# Patient Record
Sex: Female | Born: 1975 | Race: Black or African American | Hispanic: No | Marital: Married | Smoking: Never smoker
Health system: Southern US, Community
[De-identification: ages and names within clinical notes are randomized; demographics above are authoritative.]

## PROBLEM LIST (undated history)

## (undated) DIAGNOSIS — M67912 Unspecified disorder of synovium and tendon, left shoulder: Secondary | ICD-10-CM

## (undated) DIAGNOSIS — I219 Acute myocardial infarction, unspecified: Secondary | ICD-10-CM

## (undated) DIAGNOSIS — I639 Cerebral infarction, unspecified: Secondary | ICD-10-CM

## (undated) DIAGNOSIS — I341 Nonrheumatic mitral (valve) prolapse: Secondary | ICD-10-CM

## (undated) DIAGNOSIS — K529 Noninfective gastroenteritis and colitis, unspecified: Secondary | ICD-10-CM

## (undated) DIAGNOSIS — G589 Mononeuropathy, unspecified: Secondary | ICD-10-CM

## (undated) DIAGNOSIS — K219 Gastro-esophageal reflux disease without esophagitis: Secondary | ICD-10-CM

## (undated) DIAGNOSIS — J40 Bronchitis, not specified as acute or chronic: Secondary | ICD-10-CM

## (undated) DIAGNOSIS — D649 Anemia, unspecified: Secondary | ICD-10-CM

## (undated) DIAGNOSIS — E039 Hypothyroidism, unspecified: Secondary | ICD-10-CM

## (undated) DIAGNOSIS — F419 Anxiety disorder, unspecified: Secondary | ICD-10-CM

## (undated) HISTORY — DX: Unspecified disorder of synovium and tendon, left shoulder: M67.912

## (undated) HISTORY — DX: Mononeuropathy, unspecified: G58.9

## (undated) HISTORY — PX: WISDOM TOOTH EXTRACTION: SHX21

## (undated) HISTORY — PX: DILATION AND CURETTAGE OF UTERUS: SHX78

## (undated) HISTORY — PX: ABDOMINAL HYSTERECTOMY: SHX81

---

## 2006-11-08 DIAGNOSIS — I219 Acute myocardial infarction, unspecified: Secondary | ICD-10-CM

## 2006-11-08 HISTORY — DX: Acute myocardial infarction, unspecified: I21.9

## 2007-01-04 DIAGNOSIS — I639 Cerebral infarction, unspecified: Secondary | ICD-10-CM

## 2007-01-04 HISTORY — DX: Cerebral infarction, unspecified: I63.9

## 2014-04-09 ENCOUNTER — Encounter (HOSPITAL_COMMUNITY): Payer: Self-pay | Admitting: Emergency Medicine

## 2014-04-09 ENCOUNTER — Emergency Department (HOSPITAL_COMMUNITY)
Admission: EM | Admit: 2014-04-09 | Discharge: 2014-04-09 | Disposition: A | Discharge status: Home / Self Care | Payer: BC Managed Care – PPO | Attending: Emergency Medicine | Admitting: Emergency Medicine

## 2014-04-09 DIAGNOSIS — Z8709 Personal history of other diseases of the respiratory system: Secondary | ICD-10-CM | POA: Insufficient documentation

## 2014-04-09 DIAGNOSIS — Z8673 Personal history of transient ischemic attack (TIA), and cerebral infarction without residual deficits: Secondary | ICD-10-CM | POA: Diagnosis not present

## 2014-04-09 DIAGNOSIS — M791 Myalgia: Secondary | ICD-10-CM | POA: Diagnosis present

## 2014-04-09 DIAGNOSIS — M79602 Pain in left arm: Secondary | ICD-10-CM | POA: Diagnosis not present

## 2014-04-09 DIAGNOSIS — I252 Old myocardial infarction: Secondary | ICD-10-CM | POA: Diagnosis not present

## 2014-04-09 HISTORY — DX: Cerebral infarction, unspecified: I63.9

## 2014-04-09 HISTORY — DX: Acute myocardial infarction, unspecified: I21.9

## 2014-04-09 HISTORY — DX: Bronchitis, not specified as acute or chronic: J40

## 2014-04-09 LAB — I-STAT TROPONIN, ED: Troponin i, poc: 0 ng/mL (ref 0.00–0.08)

## 2014-04-09 LAB — CBC WITH DIFFERENTIAL/PLATELET
Basophils Absolute: 0 10*3/uL (ref 0.0–0.1)
Basophils Relative: 0 % (ref 0–1)
EOS ABS: 0.2 10*3/uL (ref 0.0–0.7)
Eosinophils Relative: 3 % (ref 0–5)
HCT: 38.6 % (ref 36.0–46.0)
Hemoglobin: 11.9 g/dL — ABNORMAL LOW (ref 12.0–15.0)
LYMPHS PCT: 38 % (ref 12–46)
Lymphs Abs: 2.9 10*3/uL (ref 0.7–4.0)
MCH: 23.4 pg — AB (ref 26.0–34.0)
MCHC: 30.8 g/dL (ref 30.0–36.0)
MCV: 76 fL — ABNORMAL LOW (ref 78.0–100.0)
Monocytes Absolute: 0.6 10*3/uL (ref 0.1–1.0)
Monocytes Relative: 8 % (ref 3–12)
NEUTROS PCT: 51 % (ref 43–77)
Neutro Abs: 3.8 10*3/uL (ref 1.7–7.7)
PLATELETS: 302 10*3/uL (ref 150–400)
RBC: 5.08 MIL/uL (ref 3.87–5.11)
RDW: 14.7 % (ref 11.5–15.5)
WBC: 7.5 10*3/uL (ref 4.0–10.5)

## 2014-04-09 LAB — BASIC METABOLIC PANEL WITH GFR
Anion gap: 15 (ref 5–15)
BUN: 17 mg/dL (ref 6–23)
CO2: 20 meq/L (ref 19–32)
Calcium: 9.1 mg/dL (ref 8.4–10.5)
Chloride: 106 meq/L (ref 96–112)
Creatinine, Ser: 0.71 mg/dL (ref 0.50–1.10)
GFR calc Af Amer: >90 mL/min
GFR calc non Af Amer: >90 mL/min
Glucose, Bld: 89 mg/dL (ref 70–99)
Potassium: 3.9 meq/L (ref 3.7–5.3)
Sodium: 141 meq/L (ref 137–147)

## 2014-04-09 NOTE — ED Notes (Addendum)
Pt c/o L forearm tightness radiating in upper arm and bilateral "sweaty palms" starting around 1730.  Pain score 5/10.  Pt sts "it feels like my arm fell asleep on me."  Hx of MI and stroke in 10/2006.  Pt reports similar symptoms w/ MI.
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
## 2014-04-09 NOTE — Discharge Instructions (Signed)
Followup with cardiology within the next week. Followup with primary care as well. Return to the ER if you develop any chest pain, shortness of breath, sweatiness, nausea, vomiting, pain that radiates to your left arm or jaw, dizziness, weakness, blurred or change in vision.   Pain of Unknown Etiology (Pain Without a Known Cause) You have come to your caregiver because of pain. Pain can occur in any part of the body. Often there is not a definite cause. If your laboratory (blood or urine) work was normal and X-rays or other studies were normal, your caregiver may treat you without knowing the cause of the pain. An example of this is the headache. Most headaches are diagnosed by taking a history. This means your caregiver asks you questions about your headaches. Your caregiver determines a treatment based on your answers. Usually testing done for headaches is normal. Often testing is not done unless there is no response to medications. Regardless of where your pain is located today, you can be given medications to make you comfortable. If no physical cause of pain can be found, most cases of pain will gradually leave as suddenly as they came.  If you have a painful condition and no reason can be found for the pain, it is important that you follow up with your caregiver. If the pain becomes worse or does not go away, it may be necessary to repeat tests and look further for a possible cause.  Only take over-the-counter or prescription medicines for pain, discomfort, or fever as directed by your caregiver.  For the protection of your privacy, test results cannot be given over the phone. Make sure you receive the results of your test. Ask how these results are to be obtained if you have not been informed. It is your responsibility to obtain your test results.  You may continue all activities unless the activities cause more pain. When the pain lessens, it is important to gradually resume normal activities.  Resume activities by beginning slowly and gradually increasing the intensity and duration of the activities or exercise. During periods of severe pain, bed rest may be helpful. Lie or sit in any position that is comfortable.  Ice used for acute (sudden) conditions may be effective. Use a large plastic bag filled with ice and wrapped in a towel. This may provide pain relief.  See your caregiver for continued problems. Your caregiver can help or refer you for exercises or physical therapy if necessary. If you were given medications for your condition, do not drive, operate machinery or power tools, or sign legal documents for 24 hours. Do not drink alcohol, take sleeping pills, or take other medications that may interfere with treatment. See your caregiver immediately if you have pain that is becoming worse and not relieved by medications. Document Released: 03/01/2001 Document Revised: 03/27/2013 Document Reviewed: 06/06/2005 [facility] Patient Information 2015 [place], [place]. This information is not intended to replace advice given to you by your health care provider. Make sure you discuss any questions you have with your health care provider.

## 2014-04-12 NOTE — ED Provider Notes (Signed)
Medical screening examination/treatment/procedure(s) were conducted as a shared visit with non-physician practitioner(s) and myself.  I personally evaluated the patient during the encounter.   EKG Interpretation   Date/Time:  Wednesday April 09 2014 18:21:25 EDT Ventricular Rate:  74 PR Interval:  156 QRS Duration: 73 QT Interval:  395 QTC Calculation: 438 R Axis:   38 Text Interpretation:  Sinus rhythm Low voltage, precordial leads Confirmed  by Alvino Chapel  MD, Ovid Curd [phone]) on 04/09/2014 7:19:06 PM     Patient with chest tightness. It is in her left arm.likely musculoskeletal. EKG reassuring. Will discharge home.  Jasper Riling. Alvino Chapel, MD 04/12/14 [phone]

## 2014-06-02 ENCOUNTER — Emergency Department (HOSPITAL_BASED_OUTPATIENT_CLINIC_OR_DEPARTMENT_OTHER): Payer: BC Managed Care – PPO

## 2014-06-02 ENCOUNTER — Emergency Department (HOSPITAL_BASED_OUTPATIENT_CLINIC_OR_DEPARTMENT_OTHER)
Admission: EM | Admit: 2014-06-02 | Discharge: 2014-06-02 | Disposition: A | Discharge status: Home / Self Care | Payer: BC Managed Care – PPO | Attending: Emergency Medicine | Admitting: Emergency Medicine

## 2014-06-02 ENCOUNTER — Encounter (HOSPITAL_BASED_OUTPATIENT_CLINIC_OR_DEPARTMENT_OTHER): Payer: Self-pay

## 2014-06-02 DIAGNOSIS — I252 Old myocardial infarction: Secondary | ICD-10-CM | POA: Diagnosis not present

## 2014-06-02 DIAGNOSIS — Y998 Other external cause status: Secondary | ICD-10-CM | POA: Insufficient documentation

## 2014-06-02 DIAGNOSIS — Z8709 Personal history of other diseases of the respiratory system: Secondary | ICD-10-CM | POA: Insufficient documentation

## 2014-06-02 DIAGNOSIS — Z79899 Other long term (current) drug therapy: Secondary | ICD-10-CM | POA: Insufficient documentation

## 2014-06-02 DIAGNOSIS — Z8673 Personal history of transient ischemic attack (TIA), and cerebral infarction without residual deficits: Secondary | ICD-10-CM | POA: Insufficient documentation

## 2014-06-02 DIAGNOSIS — Y92410 Unspecified street and highway as the place of occurrence of the external cause: Secondary | ICD-10-CM | POA: Diagnosis not present

## 2014-06-02 DIAGNOSIS — S4991XA Unspecified injury of right shoulder and upper arm, initial encounter: Secondary | ICD-10-CM | POA: Diagnosis present

## 2014-06-02 DIAGNOSIS — M25511 Pain in right shoulder: Secondary | ICD-10-CM

## 2014-06-02 DIAGNOSIS — Y9389 Activity, other specified: Secondary | ICD-10-CM | POA: Insufficient documentation

## 2014-06-02 MED ORDER — HYDROCODONE-ACETAMINOPHEN 5-325 MG PO TABS: 2.0000 | ORAL_TABLET | ORAL | Status: DC | PRN
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
## 2014-06-02 NOTE — ED Notes (Signed)
PA at bedside.

## 2014-06-02 NOTE — Discharge Instructions (Signed)
Arthralgia °Your caregiver has diagnosed you as suffering from an arthralgia. Arthralgia means there is pain in a joint. This can come from many reasons including: °· Bruising the joint which causes soreness (inflammation) in the joint. °· Wear and tear on the joints which occur as we grow older (osteoarthritis). °· Overusing the joint. °· Various forms of arthritis. °· Infections of the joint. °Regardless of the cause of pain in your joint, most of these different pains respond to anti-inflammatory drugs and rest. The exception to this is when a joint is infected, and these cases are treated with antibiotics, if it is a bacterial infection. °HOME CARE INSTRUCTIONS  °· Rest the injured area for as long as directed by your caregiver. Then slowly start using the joint as directed by your caregiver and as the pain allows. Crutches as directed may be useful if the ankles, knees or hips are involved. If the knee was splinted or casted, continue use and care as directed. If an stretchy or elastic wrapping bandage has been applied today, it should be removed and re-applied every 3 to 4 hours. It should not be applied tightly, but firmly enough to keep swelling down. Watch toes and feet for swelling, bluish discoloration, coldness, numbness or excessive pain. If any of these problems (symptoms) occur, remove the ace bandage and re-apply more loosely. If these symptoms persist, contact your caregiver or return to this location. °· For the first 24 hours, keep the injured extremity elevated on pillows while lying down. °· Apply ice for 15-20 minutes to the sore joint every couple hours while awake for the first half day. Then 03-04 times per day for the first 48 hours. Put the ice in a plastic bag and place a towel between the bag of ice and your skin. °· Wear any splinting, casting, elastic bandage applications, or slings as instructed. °· Only take over-the-counter or prescription medicines for pain, discomfort, or fever as  directed by your caregiver. Do not use aspirin immediately after the injury unless instructed by your physician. Aspirin can cause increased bleeding and bruising of the tissues. °· If you were given crutches, continue to use them as instructed and do not resume weight bearing on the sore joint until instructed. °Persistent pain and inability to use the sore joint as directed for more than 2 to 3 days are warning signs indicating that you should see a caregiver for a follow-up visit as soon as possible. Initially, a hairline fracture (break in bone) may not be evident on X-rays. Persistent pain and swelling indicate that further evaluation, non-weight bearing or use of the joint (use of crutches or slings as instructed), or further X-rays are indicated. X-rays may sometimes not show a small fracture until a week or 10 days later. Make a follow-up appointment with your own caregiver or one to whom we have referred you. A radiologist (specialist in reading X-rays) may read your X-rays. Make sure you know how you are to obtain your X-ray results. Do not assume everything is normal if you do not hear from us. °SEEK MEDICAL CARE IF: °Bruising, swelling, or pain increases. °SEEK IMMEDIATE MEDICAL CARE IF:  °· Your fingers or toes are numb or blue. °· The pain is not responding to medications and continues to stay the same or get worse. °· The pain in your joint becomes severe. °· You develop a fever over 102° F (38.9° C). °· It becomes impossible to move or use the joint. °MAKE SURE YOU:  °·   Understand these instructions. °· Will watch your condition. °· Will get help right away if you are not doing well or get worse. °Document Released: 06/06/2005 Document Revised: 08/29/2011 Document Reviewed: 01/23/2008 °[employer]® Patient Information ©2015 [employer]. This information is not intended to replace advice given to you by your health care provider. Make sure you discuss any questions you have with your health care  provider. °Motor Vehicle Collision °It is common to have multiple bruises and sore muscles after a motor vehicle collision (MVC). These tend to feel worse for the first 24 hours. You may have the most stiffness and soreness over the first several hours. You may also feel worse when you wake up the first morning after your collision. After this point, you will usually begin to improve with each day. The speed of improvement often depends on the severity of the collision, the number of injuries, and the location and nature of these injuries. °HOME CARE INSTRUCTIONS °· Put ice on the injured area. °¨ Put ice in a plastic bag. °¨ Place a towel between your skin and the bag. °¨ Leave the ice on for 15-20 minutes, 3-4 times a day, or as directed by your health care provider. °· Drink enough fluids to keep your urine clear or pale yellow. Do not drink alcohol. °· Take a warm shower or bath once or twice a day. This will increase blood flow to sore muscles. °· You may return to activities as directed by your caregiver. Be careful when lifting, as this may aggravate neck or back pain. °· Only take over-the-counter or prescription medicines for pain, discomfort, or fever as directed by your caregiver. Do not use aspirin. This may increase bruising and bleeding. °SEEK IMMEDIATE MEDICAL CARE IF: °· You have numbness, tingling, or weakness in the arms or legs. °· You develop severe headaches not relieved with medicine. °· You have severe neck pain, especially tenderness in the middle of the back of your neck. °· You have changes in bowel or bladder control. °· There is increasing pain in any area of the body. °· You have shortness of breath, light-headedness, dizziness, or fainting. °· You have chest pain. °· You feel sick to your stomach (nauseous), throw up (vomit), or sweat. °· You have increasing abdominal discomfort. °· There is blood in your urine, stool, or vomit. °· You have pain in your shoulder (shoulder strap  areas). °· You feel your symptoms are getting worse. °MAKE SURE YOU: °· Understand these instructions. °· Will watch your condition. °· Will get help right away if you are not doing well or get worse. °Document Released: 06/06/2005 Document Revised: 10/21/2013 Document Reviewed: 11/03/2010 °[employer]® Patient Information ©2015 [employer]. This information is not intended to replace advice given to you by your health care provider. Make sure you discuss any questions you have with your health care provider. ° °
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
## 2014-08-22 ENCOUNTER — Other Ambulatory Visit: Payer: Self-pay | Admitting: Physician Assistant

## 2014-08-22 ENCOUNTER — Other Ambulatory Visit (HOSPITAL_COMMUNITY)
Admission: RE | Admit: 2014-08-22 | Discharge: 2014-08-22 | Disposition: A | Discharge status: Home / Self Care | Payer: BC Managed Care – PPO | Source: Ambulatory Visit | Attending: Family Medicine | Admitting: Family Medicine

## 2014-08-22 DIAGNOSIS — Z124 Encounter for screening for malignant neoplasm of cervix: Secondary | ICD-10-CM | POA: Diagnosis present

## 2014-08-26 LAB — CYTOLOGY - PAP

## 2014-08-27 ENCOUNTER — Other Ambulatory Visit: Payer: Self-pay | Admitting: Physician Assistant

## 2014-08-27 DIAGNOSIS — Z1231 Encounter for screening mammogram for malignant neoplasm of breast: Secondary | ICD-10-CM

## 2014-09-18 ENCOUNTER — Ambulatory Visit
Admission: RE | Admit: 2014-09-18 | Discharge: 2014-09-18 | Disposition: A | Discharge status: Home / Self Care | Payer: BC Managed Care – PPO | Source: Ambulatory Visit | Attending: Physician Assistant | Admitting: Physician Assistant

## 2014-09-18 DIAGNOSIS — Z1231 Encounter for screening mammogram for malignant neoplasm of breast: Secondary | ICD-10-CM

## 2016-01-25 ENCOUNTER — Emergency Department (HOSPITAL_BASED_OUTPATIENT_CLINIC_OR_DEPARTMENT_OTHER)
Admission: EM | Admit: 2016-01-25 | Discharge: 2016-01-25 | Disposition: A | Discharge status: Home / Self Care | Payer: BC Managed Care – PPO | Attending: Emergency Medicine | Admitting: Emergency Medicine

## 2016-01-25 DIAGNOSIS — Z79899 Other long term (current) drug therapy: Secondary | ICD-10-CM | POA: Insufficient documentation

## 2016-01-25 DIAGNOSIS — M7582 Other shoulder lesions, left shoulder: Secondary | ICD-10-CM | POA: Insufficient documentation

## 2016-01-25 DIAGNOSIS — M25512 Pain in left shoulder: Secondary | ICD-10-CM | POA: Diagnosis present

## 2016-01-25 DIAGNOSIS — Z8673 Personal history of transient ischemic attack (TIA), and cerebral infarction without residual deficits: Secondary | ICD-10-CM | POA: Insufficient documentation

## 2016-01-25 MED ORDER — TRAMADOL HCL 50 MG PO TABS: 50.0000 mg | ORAL_TABLET | Freq: Four times a day (QID) | ORAL | 0 refills | Status: DC | PRN

## 2016-01-25 MED ORDER — PREDNISONE 10 MG PO TABS
20.0000 mg | ORAL_TABLET | Freq: Two times a day (BID) | ORAL | 0 refills | Status: DC
Start: 2016-01-25 — End: 2016-06-08

## 2016-01-25 MED FILL — traMADol HCL 50 MG TABS: 50 | 3 days supply | Qty: 15 | Fill #0

## 2016-01-25 MED FILL — predniSONE 10 MG TABS: 10 | 5 days supply | Qty: 20 | Fill #0

## 2016-01-25 NOTE — ED Notes (Signed)
Pt directed to pharmacy to pick up prescriptions
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
## 2016-01-25 NOTE — ED Notes (Signed)
MD at bedside. 

## 2016-01-25 NOTE — Discharge Instructions (Signed)
Prednisone as prescribed.  Tramadol as prescribed as needed for pain.  Wear arm sling as applied for comfort for the next several days.  Follow-up with your primary Dr. if you're not improving in the next week to discuss physical therapy or possibly imaging studies.

## 2016-03-21 ENCOUNTER — Other Ambulatory Visit: Payer: Self-pay | Admitting: Physician Assistant

## 2016-03-21 DIAGNOSIS — Z1231 Encounter for screening mammogram for malignant neoplasm of breast: Secondary | ICD-10-CM

## 2016-04-04 ENCOUNTER — Ambulatory Visit: Payer: BC Managed Care – PPO

## 2016-05-18 ENCOUNTER — Ambulatory Visit
Admission: RE | Admit: 2016-05-18 | Discharge: 2016-05-18 | Disposition: A | Discharge status: Home / Self Care | Payer: BC Managed Care – PPO | Source: Ambulatory Visit | Attending: Physician Assistant | Admitting: Physician Assistant

## 2016-05-18 DIAGNOSIS — Z1231 Encounter for screening mammogram for malignant neoplasm of breast: Secondary | ICD-10-CM

## 2016-06-08 ENCOUNTER — Emergency Department (HOSPITAL_BASED_OUTPATIENT_CLINIC_OR_DEPARTMENT_OTHER)
Admission: EM | Admit: 2016-06-08 | Discharge: 2016-06-08 | Disposition: A | Discharge status: Home / Self Care | Payer: BC Managed Care – PPO | Attending: Emergency Medicine | Admitting: Emergency Medicine

## 2016-06-08 ENCOUNTER — Encounter (HOSPITAL_BASED_OUTPATIENT_CLINIC_OR_DEPARTMENT_OTHER): Payer: Self-pay | Admitting: *Deleted

## 2016-06-08 ENCOUNTER — Emergency Department (HOSPITAL_BASED_OUTPATIENT_CLINIC_OR_DEPARTMENT_OTHER): Payer: BC Managed Care – PPO

## 2016-06-08 DIAGNOSIS — I252 Old myocardial infarction: Secondary | ICD-10-CM | POA: Diagnosis not present

## 2016-06-08 DIAGNOSIS — Z79899 Other long term (current) drug therapy: Secondary | ICD-10-CM | POA: Diagnosis not present

## 2016-06-08 DIAGNOSIS — K529 Noninfective gastroenteritis and colitis, unspecified: Secondary | ICD-10-CM | POA: Diagnosis not present

## 2016-06-08 DIAGNOSIS — R1084 Generalized abdominal pain: Secondary | ICD-10-CM | POA: Diagnosis present

## 2016-06-08 LAB — URINALYSIS, ROUTINE W REFLEX MICROSCOPIC
Bilirubin Urine: NEGATIVE
Glucose, UA: NEGATIVE mg/dL
HGB URINE DIPSTICK: NEGATIVE
KETONES UR: NEGATIVE mg/dL
Nitrite: NEGATIVE
Protein, ur: NEGATIVE mg/dL
SPECIFIC GRAVITY, URINE: 1.01 (ref 1.005–1.030)
pH: 6.5 (ref 5.0–8.0)

## 2016-06-08 LAB — URINALYSIS, MICROSCOPIC (REFLEX)

## 2016-06-08 LAB — COMPREHENSIVE METABOLIC PANEL
ALBUMIN: 3.4 g/dL — AB (ref 3.5–5.0)
ALK PHOS: 65 U/L (ref 38–126)
ALT: 13 U/L — ABNORMAL LOW (ref 14–54)
ANION GAP: 7 (ref 5–15)
AST: 21 U/L (ref 15–41)
BILIRUBIN TOTAL: 0.5 mg/dL (ref 0.3–1.2)
BUN: 13 mg/dL (ref 6–20)
CALCIUM: 8.5 mg/dL — AB (ref 8.9–10.3)
CO2: 23 mmol/L (ref 22–32)
Chloride: 109 mmol/L (ref 101–111)
Creatinine, Ser: 0.7 mg/dL (ref 0.44–1.00)
GFR calc Af Amer: >60 mL/min (ref >60)
GFR calc non Af Amer: >60 mL/min (ref >60)
GLUCOSE: 104 mg/dL — AB (ref 65–99)
Potassium: 4 mmol/L (ref 3.5–5.1)
Sodium: 139 mmol/L (ref 135–145)
Total Protein: 6.6 g/dL (ref 6.5–8.1)

## 2016-06-08 LAB — CBC WITH DIFFERENTIAL/PLATELET
BASOS PCT: 0 %
Basophils Absolute: 0 10*3/uL (ref 0.0–0.1)
Eosinophils Absolute: 0.6 10*3/uL (ref 0.0–0.7)
Eosinophils Relative: 7 %
HEMATOCRIT: 34.9 % — AB (ref 36.0–46.0)
HEMOGLOBIN: 10.8 g/dL — AB (ref 12.0–15.0)
LYMPHS PCT: 20 %
Lymphs Abs: 1.7 10*3/uL (ref 0.7–4.0)
MCH: 23.5 pg — ABNORMAL LOW (ref 26.0–34.0)
MCHC: 30.9 g/dL (ref 30.0–36.0)
MCV: 76 fL — AB (ref 78.0–100.0)
MONO ABS: 0.5 10*3/uL (ref 0.1–1.0)
MONOS PCT: 6 %
NEUTROS ABS: 5.8 10*3/uL (ref 1.7–7.7)
NEUTROS PCT: 67 %
Platelets: 324 10*3/uL (ref 150–400)
RBC: 4.59 MIL/uL (ref 3.87–5.11)
RDW: 16 % — AB (ref 11.5–15.5)
WBC: 8.6 10*3/uL (ref 4.0–10.5)

## 2016-06-08 LAB — LIPASE, BLOOD: LIPASE: 31 U/L (ref 11–51)

## 2016-06-08 LAB — PREGNANCY, URINE: Preg Test, Ur: NEGATIVE

## 2016-06-08 MED ORDER — ONDANSETRON HCL 4 MG/2ML IJ SOLN
4.0000 mg | Freq: Once | INTRAMUSCULAR | Status: AC
  Administered 2016-06-08: 4 mg via INTRAVENOUS
  Filled 2016-06-08: qty 2

## 2016-06-08 MED ORDER — MORPHINE SULFATE (PF) 4 MG/ML IV SOLN
4.0000 mg | Freq: Once | INTRAVENOUS | Status: AC
  Administered 2016-06-08: 4 mg via INTRAVENOUS
  Filled 2016-06-08: qty 1

## 2016-06-08 MED ORDER — SODIUM CHLORIDE 0.9 % IV BOLUS (SEPSIS)
1000.0000 mL | Freq: Once | INTRAVENOUS | Status: AC
  Administered 2016-06-08: 1000 mL via INTRAVENOUS

## 2016-06-08 MED ORDER — RANITIDINE HCL 150 MG/10ML PO SYRP
300.0000 mg | ORAL_SOLUTION | Freq: Once | ORAL | Status: AC
  Administered 2016-06-08: 300 mg via ORAL
  Filled 2016-06-08: qty 20

## 2016-06-08 MED ORDER — DICYCLOMINE HCL 10 MG PO CAPS
20.0000 mg | ORAL_CAPSULE | Freq: Once | ORAL | Status: AC
  Administered 2016-06-08: 20 mg via ORAL
  Filled 2016-06-08: qty 2

## 2016-06-08 MED ORDER — IOPAMIDOL (ISOVUE-300) INJECTION 61%
100.0000 mL | Freq: Once | INTRAVENOUS | Status: AC | PRN
  Administered 2016-06-08: 100 mL via INTRAVENOUS
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
## 2016-06-08 NOTE — ED Triage Notes (Signed)
Pt was seen by PCP for right wrist pain, ( tendon injury) began taking naproxen on Monday PM and is concerned that naproxen may be the cause.

## 2016-06-08 NOTE — ED Triage Notes (Signed)
Pt with N/V D as well as abd cramping x 24 hours. Denies fever

## 2016-10-18 ENCOUNTER — Encounter: Payer: Self-pay | Admitting: Endocrinology

## 2016-10-18 ENCOUNTER — Ambulatory Visit (INDEPENDENT_AMBULATORY_CARE_PROVIDER_SITE_OTHER): Payer: BC Managed Care – PPO | Admitting: Endocrinology

## 2016-10-18 DIAGNOSIS — E0590 Thyrotoxicosis, unspecified without thyrotoxic crisis or storm: Secondary | ICD-10-CM

## 2016-10-18 DIAGNOSIS — E119 Type 2 diabetes mellitus without complications: Secondary | ICD-10-CM

## 2016-10-18 MED ORDER — METHIMAZOLE 5 MG PO TABS: 5.0000 mg | ORAL_TABLET | Freq: Every day | ORAL | 2 refills | Status: DC

## 2016-10-18 NOTE — Progress Notes (Signed)
Subjective:    Patient ID: Peggy Mahoney, female    DOB: Mar 27, 1976, 41 y.o.   MRN: [id number]  HPI Pt is referred by Dr Harrington Challenger, for hyperthyroidism.  Pt reports she was dx'ed with an uncertain type of thyroid problem in 2013.  she has never been on therapy for this.  she has never had XRT to the anterior neck, or thyroid surgery.  she has never had thyroid imaging.  she does not consume kelp or any other prescribed or non-prescribed thyroid medication.  she has never been on amiodarone.   She has slight sob sensation in the chest, and assoc irreg menses.  She says she is at risk for pregnancy.    Past Medical History:  Diagnosis Date  . Bronchitis   . Diabetes ([place])   . MI (myocardial infarction) ([place])   . Stroke [facility])     No past surgical history on file.  Social History   Social History  . Marital status: Divorced    Spouse name: N/A  . Number of children: N/A  . Years of education: N/A   Occupational History  . Not on file.   Social History Main Topics  . Smoking status: Never Smoker  . Smokeless tobacco: Never Used  . Alcohol use No  . Drug use: No  . Sexual activity: Not on file   Other Topics Concern  . Not on file   Social History Narrative  . No narrative on file    Current Outpatient Prescriptions on File Prior to Visit  Medication Sig Dispense Refill  . GREEN TEA, CAMILLIA SINENSIS, PO Take by mouth.    . sertraline (ZOLOFT) 100 MG tablet Take 150 mg by mouth daily.    . naproxen (NAPROSYN) 500 MG tablet Take 500 mg by mouth 2 (two) times daily with a meal.     No current facility-administered medications on file prior to visit.     Allergies  Allergen Reactions  . Naproxen Nausea And Vomiting    Family History  Problem Relation Age of Onset  . Thyroid disease Neg Hx     BP 112/66   Pulse 74   Ht 5\' 4"  (1.626 m)   Wt 196 lb (88.9 kg)   SpO2 98%   BMI 33.64 kg/m    Review of Systems denies weight loss, headache, hoarseness,  diplopia, palpitations, diarrhea, polyuria, muscle weakness, excessive diaphoresis, tremor, heat intolerance, easy bruising, and rhinorrhea.  She has anxiety.      Objective:   Physical Exam VS: see vs page GEN: no distress HEAD: head: no deformity eyes: no periorbital swelling, no proptosis external nose and ears are normal mouth: no lesion seen NECK: thyroid is slightly enlarged on the right, and normal on the left CHEST WALL: no deformity LUNGS: clear to auscultation CV: reg rate and rhythm, no murmur ABD: abdomen is soft, nontender.  no hepatosplenomegaly.  not distended.  no hernia MUSCULOSKELETAL: muscle bulk and strength are grossly normal.  no obvious joint swelling.  gait is normal and steady EXTEMITIES: no deformity.  no edema PULSES: no carotid bruit NEURO:  cn 2-12 grossly intact.   readily moves all 4's.  sensation is intact to touch on all 4's SKIN:  Normal texture and temperature.  No rash or suspicious lesion is visible.   NODES:  None palpable at the neck PSYCH: alert, well-oriented.  Does not appear anxious nor depressed.    outside test results are reviewed: TSH=0.21  I have reviewed outside  records, and summarized: Pt was noted to have suppressed TSH, and referred here. Other med probs, including DM, were addressed.     Assessment & Plan:  Hyperthyroidism, mild, new to me: usually due to small multinodular goiter, on rx.    Patient Instructions  blood tests are requested for you today.  We'll let you know about the results. If ever you have fever while taking methimazole, stop it and call us, even if the reason is obvious, because of the risk of a rare side-effect. In view of your medical condition, you should avoid pregnancy until we have decided it is safe.   Please come back for a follow-up appointment in 1 month.

## 2016-10-18 NOTE — Patient Instructions (Addendum)
blood tests are requested for you today.  We'll let you know about the results. If ever you have fever while taking methimazole, stop it and call us, even if the reason is obvious, because of the risk of a rare side-effect. In view of your medical condition, you should avoid pregnancy until we have decided it is safe.   Please come back for a follow-up appointment in 1 month.

## 2016-10-19 ENCOUNTER — Encounter: Payer: Self-pay | Admitting: Endocrinology

## 2016-10-19 DIAGNOSIS — E0590 Thyrotoxicosis, unspecified without thyrotoxic crisis or storm: Secondary | ICD-10-CM | POA: Insufficient documentation

## 2016-10-19 DIAGNOSIS — E119 Type 2 diabetes mellitus without complications: Secondary | ICD-10-CM | POA: Insufficient documentation

## 2016-11-12 ENCOUNTER — Encounter (HOSPITAL_BASED_OUTPATIENT_CLINIC_OR_DEPARTMENT_OTHER): Payer: Self-pay | Admitting: Emergency Medicine

## 2016-11-12 ENCOUNTER — Emergency Department (HOSPITAL_BASED_OUTPATIENT_CLINIC_OR_DEPARTMENT_OTHER)
Admission: EM | Admit: 2016-11-12 | Discharge: 2016-11-12 | Disposition: A | Discharge status: Home / Self Care | Payer: BC Managed Care – PPO | Attending: Emergency Medicine | Admitting: Emergency Medicine

## 2016-11-12 DIAGNOSIS — Z7984 Long term (current) use of oral hypoglycemic drugs: Secondary | ICD-10-CM | POA: Diagnosis not present

## 2016-11-12 DIAGNOSIS — I10 Essential (primary) hypertension: Secondary | ICD-10-CM | POA: Diagnosis not present

## 2016-11-12 DIAGNOSIS — R110 Nausea: Secondary | ICD-10-CM | POA: Insufficient documentation

## 2016-11-12 DIAGNOSIS — M79645 Pain in left finger(s): Secondary | ICD-10-CM | POA: Insufficient documentation

## 2016-11-12 DIAGNOSIS — E119 Type 2 diabetes mellitus without complications: Secondary | ICD-10-CM | POA: Diagnosis not present

## 2016-11-12 DIAGNOSIS — E0590 Thyrotoxicosis, unspecified without thyrotoxic crisis or storm: Secondary | ICD-10-CM | POA: Insufficient documentation

## 2016-11-12 DIAGNOSIS — R2232 Localized swelling, mass and lump, left upper limb: Secondary | ICD-10-CM | POA: Diagnosis present

## 2016-11-12 LAB — COMPREHENSIVE METABOLIC PANEL
ALBUMIN: 3.5 g/dL (ref 3.5–5.0)
ALT: 11 U/L — ABNORMAL LOW (ref 14–54)
AST: 16 U/L (ref 15–41)
Alkaline Phosphatase: 67 U/L (ref 38–126)
Anion gap: 7 (ref 5–15)
BUN: 21 mg/dL — AB (ref 6–20)
CHLORIDE: 108 mmol/L (ref 101–111)
CO2: 25 mmol/L (ref 22–32)
CREATININE: 0.88 mg/dL (ref 0.44–1.00)
Calcium: 9 mg/dL (ref 8.9–10.3)
GFR calc Af Amer: >60 mL/min (ref >60)
GLUCOSE: 117 mg/dL — AB (ref 65–99)
POTASSIUM: 3.9 mmol/L (ref 3.5–5.1)
Sodium: 140 mmol/L (ref 135–145)
Total Bilirubin: 0.3 mg/dL (ref 0.3–1.2)
Total Protein: 6.8 g/dL (ref 6.5–8.1)

## 2016-11-12 LAB — CBC WITH DIFFERENTIAL/PLATELET
Basophils Absolute: 0 10*3/uL (ref 0.0–0.1)
Basophils Relative: 0 %
EOS ABS: 0.7 10*3/uL (ref 0.0–0.7)
EOS PCT: 8 %
HCT: 32.3 % — ABNORMAL LOW (ref 36.0–46.0)
Hemoglobin: 10 g/dL — ABNORMAL LOW (ref 12.0–15.0)
LYMPHS ABS: 2.6 10*3/uL (ref 0.7–4.0)
LYMPHS PCT: 32 %
MCH: 23.3 pg — AB (ref 26.0–34.0)
MCHC: 31 g/dL (ref 30.0–36.0)
MCV: 75.1 fL — AB (ref 78.0–100.0)
MONO ABS: 0.6 10*3/uL (ref 0.1–1.0)
MONOS PCT: 7 %
Neutro Abs: 4.2 10*3/uL (ref 1.7–7.7)
Neutrophils Relative %: 53 %
PLATELETS: 318 10*3/uL (ref 150–400)
RBC: 4.3 MIL/uL (ref 3.87–5.11)
RDW: 16.5 % — AB (ref 11.5–15.5)
WBC: 8.1 10*3/uL (ref 4.0–10.5)

## 2016-11-12 MED ORDER — PREDNISONE 50 MG PO TABS: ORAL_TABLET | ORAL | 0 refills | Status: DC

## 2016-11-12 MED ORDER — PREDNISONE 20 MG PO TABS
40.0000 mg | ORAL_TABLET | Freq: Once | ORAL | Status: AC
  Administered 2016-11-12: 40 mg via ORAL
  Filled 2016-11-12: qty 2

## 2016-11-12 MED ORDER — HYDROCODONE-ACETAMINOPHEN 5-325 MG PO TABS
1.0000 | ORAL_TABLET | Freq: Once | ORAL | Status: AC
  Administered 2016-11-12: 1 via ORAL
  Filled 2016-11-12: qty 1

## 2016-11-12 MED ORDER — DIPHENHYDRAMINE HCL 25 MG PO CAPS
25.0000 mg | ORAL_CAPSULE | Freq: Once | ORAL | Status: AC
  Administered 2016-11-12: 25 mg via ORAL
  Filled 2016-11-12: qty 1

## 2016-11-12 MED ORDER — HYDROCODONE-ACETAMINOPHEN 5-325 MG PO TABS: 1.0000 | ORAL_TABLET | Freq: Four times a day (QID) | ORAL | 0 refills | Status: DC | PRN

## 2016-11-12 MED ORDER — ONDANSETRON HCL 4 MG/2ML IJ SOLN
4.0000 mg | Freq: Once | INTRAMUSCULAR | Status: AC | PRN
  Administered 2016-11-12: 4 mg via INTRAVENOUS
  Filled 2016-11-12: qty 2

## 2016-11-12 NOTE — Discharge Instructions (Signed)
Take prednisone for 5 days starting tomorrow as directed. Take pain medication as needed. Follow-up with PCP for further evaluation. Return to ED for worsening pain, fevers, numbness, weakness, trouble walking, vision changes or additional injury.

## 2016-11-12 NOTE — ED Provider Notes (Signed)
[facility] Provider Note   CSN: [id number] Arrival date & time: 11/12/16  1955  By signing my name below, I, Mayer Masker, attest that this documentation has been prepared under the direction and in the presence of Azaiah Mello PA-C. Electronically Signed: Mayer Masker, Scribe. 11/12/16. 9:04 PM.  History   Chief Complaint Chief Complaint  Patient presents with  . Arm Swelling  . Nausea   The history is provided by the patient. No language interpreter was used.   HPI Comments: Peggy Mahoney is a 41 y.o. female with a PMHx MI, stroke, and colitis who presents to the Emergency Department complaining of constant, gradually worsening left arm pain that radiates up her arm since 2 days That originated in her index finger. She describes the pain as a tightness. She has associated arm swelling, arm itchiness, HA, nausea, decreased energy, heart burn, decreased appetite, and diarrhea. She states she was at a campground and suspects she was bit by a bug, but did not see or feel anything. She has taken motrin and tylenol with mild relief. She denies blurry vision, LOC, head injury, SOB, CP, abdominal pain, light-headedness, constipation, fever, rashes, or numbness.   Past Medical History:  Diagnosis Date  . Bronchitis   . Diabetes ([place])   . MI (myocardial infarction) ([place])   . Stroke [facility])     Patient Active Problem List   Diagnosis Date Noted  . Hyperthyroidism 10/19/2016  . Diabetes ([place])     History reviewed. No pertinent surgical history.  OB History    No data available       Home Medications    Prior to Admission medications   Medication Sig Start Date End Date Taking? Authorizing Provider  GREEN TEA, CAMILLIA SINENSIS, PO Take by mouth.    [provider]  HYDROcodone-acetaminophen (NORCO/VICODIN) 5-325 MG tablet Take 1 tablet by mouth every 6 (six) hours as needed. 11/12/16   Latrail Pounders, PA-C  hyoscyamine (LEVSIN SL) 0.125 MG SL tablet   07/14/16   [provider]  methimazole (TAPAZOLE) 5 MG tablet Take 1 tablet (5 mg total) by mouth daily. 10/18/16   Renato Shin, MD  naproxen (NAPROSYN) 500 MG tablet Take 500 mg by mouth 2 (two) times daily with a meal.    [provider]  ondansetron (ZOFRAN) 8 MG tablet  07/14/16   [provider]  predniSONE (DELTASONE) 50 MG tablet Take once daily 11/12/16   Delia Heady, PA-C  ranitidine (ZANTAC) 150 MG capsule  07/14/16   [provider]  sertraline (ZOLOFT) 100 MG tablet Take 150 mg by mouth daily.    [provider]    Family History Family History  Problem Relation Age of Onset  . Thyroid disease Neg Hx     Social History Social History  Substance Use Topics  . Smoking status: Never Smoker  . Smokeless tobacco: Never Used  . Alcohol use No     Allergies   Naproxen   Review of Systems Review of Systems  Constitutional: Positive for activity change (decreased energy) and appetite change (decreased appetite). Negative for fever.  Eyes: Negative for visual disturbance.  Respiratory: Negative for shortness of breath.   Cardiovascular: Negative for chest pain.  Gastrointestinal: Positive for diarrhea and nausea. Negative for abdominal pain and constipation.  Musculoskeletal: Positive for myalgias.       Positive for: arm swelling, arm itchiness  Skin: Negative for rash.  Neurological: Positive for headaches. Negative for syncope, light-headedness and  numbness.     Physical Exam Updated Vital Signs BP 108/68 (BP Location: Left Arm)   Pulse 72   Temp 98.1 F (36.7 C) (Oral)   Resp 18   LMP 11/05/2016   SpO2 99%   Physical Exam  Constitutional: She is oriented to person, place, and time. She appears well-developed and well-nourished. No distress.  HENT:  Head: Normocephalic and atraumatic.  Nose: Nose normal.  Eyes: Conjunctivae and EOM are normal. Left eye exhibits no discharge. No scleral icterus.  Neck: Normal  range of motion. Neck supple.  Cardiovascular: Normal rate, regular rhythm, normal heart sounds and intact distal pulses.  Exam reveals no gallop and no friction rub.   No murmur heard. Pulmonary/Chest: Effort normal and breath sounds normal. No respiratory distress.  Abdominal: Soft. Bowel sounds are normal. She exhibits no distension. There is no tenderness. There is no guarding.  Musculoskeletal: Normal range of motion. She exhibits no edema.  Swelling of the left index finger with associated erythema Full ROM of digits area appears neurovascularly intact No visible bite, wound, or other sign of trauma   Neurological: She is alert and oriented to person, place, and time. No sensory deficit. She exhibits normal muscle tone. Coordination normal.  Skin: Skin is warm and dry. No rash noted.  Psychiatric: She has a normal mood and affect.  Nursing note and vitals reviewed.    ED Treatments / Results  DIAGNOSTIC STUDIES: Oxygen Saturation is 100% on RA, normal by my interpretation.    COORDINATION OF CARE: 8:59 PM Discussed treatment plan with pt at bedside and pt agreed to plan.  Labs (all labs ordered are listed, but only abnormal results are displayed) Labs Reviewed  COMPREHENSIVE METABOLIC PANEL - Abnormal; Notable for the following:       Result Value   Glucose, Bld 117 (*)    BUN 21 (*)    ALT 11 (*)    All other components within normal limits  CBC WITH DIFFERENTIAL/PLATELET - Abnormal; Notable for the following:    Hemoglobin 10.0 (*)    HCT 32.3 (*)    MCV 75.1 (*)    MCH 23.3 (*)    RDW 16.5 (*)    All other components within normal limits    EKG  EKG Interpretation None       Radiology No results found.  Procedures Procedures (including critical care time)  Medications Ordered in ED Medications  ondansetron (ZOFRAN) injection 4 mg (4 mg Intravenous Given 11/12/16 2100)  diphenhydrAMINE (BENADRYL) capsule 25 mg (25 mg Oral Given 11/12/16 2142)    predniSONE (DELTASONE) tablet 40 mg (40 mg Oral Given 11/12/16 2142)  HYDROcodone-acetaminophen (NORCO/VICODIN) 5-325 MG per tablet 1 tablet (1 tablet Oral Given 11/12/16 2142)     Initial Impression / Assessment and Plan / ED Course  I have reviewed the triage vital signs and the nursing notes.  Pertinent labs & imaging results that were available during my care of the patient were reviewed by me and considered in my medical decision making (see chart for details).     Patient's history and symptoms concerning for spider bite versus cellulitis. Patient has a history of being in the woods and could have possibly been bit by something. Although unlikely she could be experiencing a reaction to a spider bite such as a brown recluse based on the area that she was in. There are no signs of systemic illness present. Patient is afebrile. There is an area of induration noted were  bite could've happened. Neurological exam appears normal without focal deficit at this time. We will give steroids, Benadryl,  pain medication and Zofran here in the ED and will discharge with prednisone burst. I advised her to follow-up with her PCP for further evaluation. Strict return precautions given.  Final Clinical Impressions(s) / ED Diagnoses   Final diagnoses:  Pain of finger of left hand  Nausea    New Prescriptions New Prescriptions   HYDROCODONE-ACETAMINOPHEN (NORCO/VICODIN) 5-325 MG TABLET    Take 1 tablet by mouth every 6 (six) hours as needed.   PREDNISONE (DELTASONE) 50 MG TABLET    Take once daily   I personally performed the services described in this documentation, which was scribed in my presence. The recorded information has been reviewed and is accurate.     Delia Heady, PA-C 11/12/16 2156    Blanchie Dessert, MD 11/12/16 2328

## 2016-11-12 NOTE — ED Triage Notes (Signed)
Pt presents to ED with complaints of left arm swelling throbbing and aching pain in left arm and sore and tight index finger. PT reports weakness and nausea. Husband states she seems sluggish.  Pt states this started Thursday night she was outside at [place] ground and itching to left arm started that night and swelling started next morning.

## 2016-11-18 ENCOUNTER — Other Ambulatory Visit: Payer: Self-pay | Admitting: Physician Assistant

## 2016-11-18 DIAGNOSIS — N921 Excessive and frequent menstruation with irregular cycle: Secondary | ICD-10-CM

## 2016-12-13 ENCOUNTER — Ambulatory Visit (INDEPENDENT_AMBULATORY_CARE_PROVIDER_SITE_OTHER): Payer: BC Managed Care – PPO | Admitting: Endocrinology

## 2016-12-13 ENCOUNTER — Encounter: Payer: Self-pay | Admitting: Endocrinology

## 2016-12-13 VITALS — BP 110/62 | HR 77 | Wt 197.0 lb

## 2016-12-13 DIAGNOSIS — E0590 Thyrotoxicosis, unspecified without thyrotoxic crisis or storm: Secondary | ICD-10-CM

## 2016-12-13 LAB — TSH: TSH: 0.83 u[IU]/mL (ref 0.35–4.50)

## 2016-12-13 LAB — T4, FREE: Free T4: 0.64 ng/dL (ref 0.60–1.60)

## 2016-12-13 LAB — T3, FREE: T3 FREE: 3.1 pg/mL (ref 2.3–4.2)

## 2016-12-13 NOTE — Progress Notes (Signed)
   Subjective:    Patient ID: Peggy Mahoney, female    DOB: 1976-03-24, 41 y.o.   MRN: [id number]  HPI Pt returns for f/u of hyperthyroidism (she was dx'ed with an uncertain type of thyroid problem in 2013; she has never had thyroid imaging; as it mild, it is prob due to small multinodular goiter; she was rx'ed with tapazole).  Since on the tapazole, pt states she feels well in general, except for weight gain and fatigue.   Past Medical History:  Diagnosis Date  . Bronchitis   . Diabetes ([place])   . MI (myocardial infarction) ([place])   . Stroke [facility])     No past surgical history on file.  Social History   Social History  . Marital status: Divorced    Spouse name: N/A  . Number of children: N/A  . Years of education: N/A   Occupational History  . Not on file.   Social History Main Topics  . Smoking status: Never Smoker  . Smokeless tobacco: Never Used  . Alcohol use No  . Drug use: No  . Sexual activity: Not on file   Other Topics Concern  . Not on file   Social History Narrative  . No narrative on file    Current Outpatient Prescriptions on File Prior to Visit  Medication Sig Dispense Refill  . GREEN TEA, CAMILLIA SINENSIS, PO Take by mouth.    . hyoscyamine (LEVSIN SL) 0.125 MG SL tablet     . methimazole (TAPAZOLE) 5 MG tablet Take 1 tablet (5 mg total) by mouth daily. 30 tablet 2  . naproxen (NAPROSYN) 500 MG tablet Take 500 mg by mouth 2 (two) times daily with a meal.    . ondansetron (ZOFRAN) 8 MG tablet     . predniSONE (DELTASONE) 50 MG tablet Take once daily 5 tablet 0  . ranitidine (ZANTAC) 150 MG capsule     . sertraline (ZOLOFT) 100 MG tablet Take 150 mg by mouth daily.     No current facility-administered medications on file prior to visit.     Allergies  Allergen Reactions  . Naproxen Nausea And Vomiting    Family History  Problem Relation Age of Onset  . Thyroid disease Neg Hx     BP 110/62 (BP Location: Left Arm, Patient Position:  Sitting)   Pulse 77   Wt 197 lb (89.4 kg)   LMP 12/05/2016   SpO2 96%   BMI 33.81 kg/m    Review of Systems Denies fever.    Objective:   Physical Exam VITAL SIGNS:  See vs page GENERAL: no distress NECK: thyroid is slightly and diffusely enlarged SKIN: slightly diaphoretic.    Lab Results  Component Value Date   TSH 0.83 12/13/2016      Assessment & Plan:  Hyperthyroidism, well-controlled.  Please continue the same medication.

## 2016-12-13 NOTE — Patient Instructions (Addendum)
blood tests are requested for you today.  We'll let you know about the results. If ever you have fever while taking methimazole, stop it and call us, even if the reason is obvious, because of the risk of a rare side-effect. In view of your medical condition, you should avoid pregnancy until we have decided it is safe.   Please come back for a follow-up appointment in 3 months.

## 2017-02-28 ENCOUNTER — Emergency Department (HOSPITAL_BASED_OUTPATIENT_CLINIC_OR_DEPARTMENT_OTHER)
Admission: EM | Admit: 2017-02-28 | Discharge: 2017-02-28 | Disposition: A | Discharge status: Home / Self Care | Payer: BC Managed Care – PPO | Attending: Emergency Medicine | Admitting: Emergency Medicine

## 2017-02-28 ENCOUNTER — Encounter (HOSPITAL_BASED_OUTPATIENT_CLINIC_OR_DEPARTMENT_OTHER): Payer: Self-pay | Admitting: Emergency Medicine

## 2017-02-28 DIAGNOSIS — Z79899 Other long term (current) drug therapy: Secondary | ICD-10-CM | POA: Insufficient documentation

## 2017-02-28 DIAGNOSIS — Y999 Unspecified external cause status: Secondary | ICD-10-CM | POA: Diagnosis not present

## 2017-02-28 DIAGNOSIS — Y9301 Activity, walking, marching and hiking: Secondary | ICD-10-CM | POA: Insufficient documentation

## 2017-02-28 DIAGNOSIS — K2900 Acute gastritis without bleeding: Secondary | ICD-10-CM

## 2017-02-28 DIAGNOSIS — W0110XA Fall on same level from slipping, tripping and stumbling with subsequent striking against unspecified object, initial encounter: Secondary | ICD-10-CM | POA: Insufficient documentation

## 2017-02-28 DIAGNOSIS — S92534A Nondisplaced fracture of distal phalanx of right lesser toe(s), initial encounter for closed fracture: Secondary | ICD-10-CM | POA: Insufficient documentation

## 2017-02-28 DIAGNOSIS — K5900 Constipation, unspecified: Secondary | ICD-10-CM

## 2017-02-28 DIAGNOSIS — Y929 Unspecified place or not applicable: Secondary | ICD-10-CM | POA: Diagnosis not present

## 2017-02-28 DIAGNOSIS — S99921A Unspecified injury of right foot, initial encounter: Secondary | ICD-10-CM | POA: Diagnosis present

## 2017-02-28 LAB — COMPREHENSIVE METABOLIC PANEL
ALT: 12 U/L — ABNORMAL LOW (ref 14–54)
ANION GAP: 8 (ref 5–15)
AST: 16 U/L (ref 15–41)
Albumin: 4 g/dL (ref 3.5–5.0)
Alkaline Phosphatase: 75 U/L (ref 38–126)
BUN: 15 mg/dL (ref 6–20)
CHLORIDE: 104 mmol/L (ref 101–111)
CO2: 25 mmol/L (ref 22–32)
Calcium: 8.8 mg/dL — ABNORMAL LOW (ref 8.9–10.3)
Creatinine, Ser: 0.62 mg/dL (ref 0.44–1.00)
GFR calc Af Amer: >60 mL/min (ref >60)
GFR calc non Af Amer: >60 mL/min (ref >60)
GLUCOSE: 108 mg/dL — AB (ref 65–99)
POTASSIUM: 3.7 mmol/L (ref 3.5–5.1)
SODIUM: 137 mmol/L (ref 135–145)
Total Bilirubin: 0.2 mg/dL — ABNORMAL LOW (ref 0.3–1.2)
Total Protein: 7.5 g/dL (ref 6.5–8.1)

## 2017-02-28 LAB — CBC
HEMATOCRIT: 31.9 % — AB (ref 36.0–46.0)
HEMOGLOBIN: 9.8 g/dL — AB (ref 12.0–15.0)
MCH: 22.2 pg — AB (ref 26.0–34.0)
MCHC: 30.7 g/dL (ref 30.0–36.0)
MCV: 72.3 fL — AB (ref 78.0–100.0)
Platelets: 376 10*3/uL (ref 150–400)
RBC: 4.41 MIL/uL (ref 3.87–5.11)
RDW: 16.2 % — ABNORMAL HIGH (ref 11.5–15.5)
WBC: 7.2 10*3/uL (ref 4.0–10.5)

## 2017-02-28 LAB — URINALYSIS, ROUTINE W REFLEX MICROSCOPIC
BILIRUBIN URINE: NEGATIVE
GLUCOSE, UA: NEGATIVE mg/dL
Ketones, ur: NEGATIVE mg/dL
NITRITE: NEGATIVE
PH: 6 (ref 5.0–8.0)
Protein, ur: NEGATIVE mg/dL
SPECIFIC GRAVITY, URINE: 1.02 (ref 1.005–1.030)

## 2017-02-28 LAB — URINALYSIS, MICROSCOPIC (REFLEX)

## 2017-02-28 LAB — LIPASE, BLOOD: Lipase: 37 U/L (ref 11–51)

## 2017-02-28 MED ORDER — GI COCKTAIL ~~LOC~~
30.0000 mL | Freq: Once | ORAL | Status: AC
  Administered 2017-02-28: 30 mL via ORAL
  Filled 2017-02-28: qty 30

## 2017-02-28 MED ORDER — ONDANSETRON 8 MG PO TBDP
8.0000 mg | ORAL_TABLET | Freq: Once | ORAL | Status: AC
  Administered 2017-02-28: 8 mg via ORAL
  Filled 2017-02-28: qty 1

## 2017-02-28 MED ORDER — ACETAMINOPHEN 325 MG PO TABS
650.0000 mg | ORAL_TABLET | Freq: Once | ORAL | Status: AC
  Administered 2017-02-28: 650 mg via ORAL
  Filled 2017-02-28: qty 2

## 2017-02-28 NOTE — ED Notes (Signed)
Family at bedside. 

## 2017-02-28 NOTE — ED Notes (Signed)
Pt verbalized understanding of discharge instructions and denies any further questions at this time.   

## 2017-02-28 NOTE — ED Provider Notes (Signed)
[facility] DEPT [facility] Provider Note   CSN: [id number] Arrival date & time: 02/28/17  1428  History   Chief Complaint Chief Complaint  Patient presents with  . Abdominal Pain    HPI Peggy Mahoney is a 41 y.o. female with PMH of hyperthyroidism, T2DM, depression and colitis presenting with abdominal pain. She states she was working and at lunch time had sudden onset of lower abdominal pain that was a 10/10 started from LLQ radiating to RLQ. She had associated nausea with this. No vomiting. No diarrhea- has not had BM in 2 days. She has felt diaphoretic today. She denies fevers or chills. She reports she is supposed to be avoiding certain foods but today had 2 cups of coffee and french fries at lunch. She is currently on her menstrual cycle and states this does not feel like period cramps. She also has been out of her methimazole for past 2 weeks.   HPI  Past Medical History:  Diagnosis Date  . Bronchitis   . MI (myocardial infarction) ([place])   . Stroke [facility])     Patient Active Problem List   Diagnosis Date Noted  . Hyperthyroidism 10/19/2016  . Diabetes ([place])     History reviewed. No pertinent surgical history.  OB History    No data available       Home Medications    Prior to Admission medications   Medication Sig Start Date End Date Taking? Authorizing Provider  GREEN TEA, CAMILLIA SINENSIS, PO Take by mouth.    [provider]  hyoscyamine (LEVSIN SL) 0.125 MG SL tablet  07/14/16   [provider]  methimazole (TAPAZOLE) 5 MG tablet Take 1 tablet (5 mg total) by mouth daily. 10/18/16   Renato Shin, MD  naproxen (NAPROSYN) 500 MG tablet Take 500 mg by mouth 2 (two) times daily with a meal.    [provider]  ondansetron (ZOFRAN) 8 MG tablet  07/14/16   [provider]  predniSONE (DELTASONE) 50 MG tablet Take once daily 11/12/16   Delia Heady, PA-C  ranitidine (ZANTAC) 150 MG capsule  07/14/16   [provider]    sertraline (ZOLOFT) 100 MG tablet Take 150 mg by mouth daily.    [provider]    Family History Family History  Problem Relation Age of Onset  . Thyroid disease Neg Hx     Social History Social History  Substance Use Topics  . Smoking status: Never Smoker  . Smokeless tobacco: Never Used  . Alcohol use No     Allergies   Naproxen   Review of Systems Review of Systems  Constitutional: Positive for diaphoresis. Negative for activity change, appetite change, chills, fatigue and fever.  HENT: Negative for congestion, ear pain and sore throat.   Eyes: Negative for pain.  Respiratory: Negative for cough, chest tightness and shortness of breath.   Cardiovascular: Negative for chest pain.  Gastrointestinal: Positive for abdominal pain, constipation and nausea. Negative for abdominal distention, anal bleeding, blood in stool, diarrhea, rectal pain and vomiting.  Genitourinary: Negative for difficulty urinating, dysuria, frequency, hematuria and urgency.  Musculoskeletal: Negative for back pain, myalgias and neck pain.  Skin: Negative for rash.  Neurological: Negative for weakness and headaches.     Physical Exam Updated Vital Signs BP (!) 100/50 (BP Location: Right Arm)   Pulse 72   Temp 98.5 F (36.9 C) (Oral)   Resp 18   Ht 5\' 4"  (1.626 m)   Wt 87.1 kg (192  lb)   LMP 02/28/2017   SpO2 100%   BMI 32.96 kg/m   Physical Exam  Constitutional: She is oriented to person, place, and time. She appears well-developed and well-nourished. No distress.  HENT:  Head: Normocephalic and atraumatic.  Mouth/Throat: Oropharynx is clear and moist.  Eyes: Pupils are equal, round, and reactive to light. EOM are normal.  Neck: Normal range of motion. Neck supple. No thyromegaly present.  Cardiovascular: Normal rate, regular rhythm, normal heart sounds and intact distal pulses.   No murmur heard. Pulmonary/Chest: Effort normal and breath sounds normal.  Abdominal: Soft.  She exhibits no distension and no mass. Bowel sounds are decreased. There is generalized tenderness. There is no guarding and negative Murphy's sign. No hernia.  Musculoskeletal: Normal range of motion.  Lymphadenopathy:    She has no cervical adenopathy.  Neurological: She is alert and oriented to person, place, and time. She exhibits normal muscle tone.  Skin: Skin is warm and dry. No rash noted. She is not diaphoretic.  Psychiatric: She has a normal mood and affect. Her behavior is normal. Thought content normal.   ED Treatments / Results  Labs (all labs ordered are listed, but only abnormal results are displayed) Labs Reviewed  COMPREHENSIVE METABOLIC PANEL - Abnormal; Notable for the following:       Result Value   Glucose, Bld 108 (*)    Calcium 8.8 (*)    ALT 12 (*)    Total Bilirubin 0.2 (*)    All other components within normal limits  CBC - Abnormal; Notable for the following:    Hemoglobin 9.8 (*)    HCT 31.9 (*)    MCV 72.3 (*)    MCH 22.2 (*)    RDW 16.2 (*)    All other components within normal limits  URINALYSIS, ROUTINE W REFLEX MICROSCOPIC - Abnormal; Notable for the following:    Hgb urine dipstick SMALL (*)    Leukocytes, UA TRACE (*)    All other components within normal limits  URINALYSIS, MICROSCOPIC (REFLEX) - Abnormal; Notable for the following:    Bacteria, UA MANY (*)    Squamous Epithelial / LPF 0-5 (*)    All other components within normal limits  LIPASE, BLOOD    EKG  EKG Interpretation None       Radiology No results found.  Procedures Procedures (including critical care time)  Medications Ordered in ED Medications  ondansetron (ZOFRAN-ODT) disintegrating tablet 8 mg (8 mg Oral Given 02/28/17 1617)  acetaminophen (TYLENOL) tablet 650 mg (650 mg Oral Given 02/28/17 1617)  gi cocktail (Maalox,Lidocaine,Donnatal) (30 mLs Oral Given 02/28/17 1720)     Initial Impression / Assessment and Plan / ED Course  I have reviewed the triage  vital signs and the nursing notes.  Pertinent labs & imaging results that were available during my care of the patient were reviewed by me and considered in my medical decision making (see chart for details).     Patient presenting with lower abdominal pain most consistent with acute gastritis. Additionally she has several other possible sources for her pain including constipation and menstrual pain. Patient given zofran for nausea, tylenol for pain, and GI cocktail with improvement in her symptoms. No red flag symptoms. Patient stable for discharge home. Advised tylenol for pain and avoidance of trigger foods including spicy foods and caffeine. Follow up with PCP. Patient verbalized understanding and agreement with plan.   Final Clinical Impressions(s) / ED Diagnoses   Final diagnoses:  Constipation,  unspecified constipation type  Acute gastritis without hemorrhage, unspecified gastritis type    New Prescriptions Discharge Medication List as of 02/28/2017  5:18 PM      Lucila Maine, DO PGY-2, [facility] 02/28/2017 5:31 PM    Steve Rattler, DO 02/28/17 1731    Cardama, Grayce Sessions, MD 02/28/17 1737

## 2017-02-28 NOTE — ED Notes (Signed)
ED Provider at bedside. 

## 2017-02-28 NOTE — ED Triage Notes (Signed)
Patient states that she started to have left lower pelvic / quadrant pain about 2 hours ago. REports that she is nauseated but denies any Vomiting

## 2017-03-15 ENCOUNTER — Ambulatory Visit (INDEPENDENT_AMBULATORY_CARE_PROVIDER_SITE_OTHER): Payer: BC Managed Care – PPO | Admitting: Endocrinology

## 2017-03-15 ENCOUNTER — Encounter: Payer: Self-pay | Admitting: Endocrinology

## 2017-03-15 VITALS — BP 114/78 | HR 95 | Wt 201.8 lb

## 2017-03-15 DIAGNOSIS — E119 Type 2 diabetes mellitus without complications: Secondary | ICD-10-CM

## 2017-03-15 DIAGNOSIS — K589 Irritable bowel syndrome without diarrhea: Secondary | ICD-10-CM | POA: Diagnosis not present

## 2017-03-15 LAB — TSH: TSH: 0.62 u[IU]/mL (ref 0.35–4.50)

## 2017-03-15 LAB — T4, FREE: Free T4: 0.94 ng/dL (ref 0.60–1.60)

## 2017-03-15 NOTE — Progress Notes (Signed)
   Subjective:    Patient ID: Peggy Mahoney, female    DOB: July 14, 1975, 41 y.o.   MRN: [id number]  HPI Pt returns for f/u of hyperthyroidism (she was dx'ed with an uncertain type of thyroid problem in 2013; she has never had thyroid imaging; as it mild, it is prob due to small multinodular goiter; she was rx'ed with tapazole). pt states she feels well in general, except for difficulty losing weight.  She takes tapazole as rx'ed.  Past Medical History:  Diagnosis Date  . Bronchitis   . MI (myocardial infarction) ([place])   . Stroke [facility])     No past surgical history on file.  Social History   Social History  . Marital status: Divorced    Spouse name: N/A  . Number of children: N/A  . Years of education: N/A   Occupational History  . Not on file.   Social History Main Topics  . Smoking status: Never Smoker  . Smokeless tobacco: Never Used  . Alcohol use No  . Drug use: No  . Sexual activity: Not on file   Other Topics Concern  . Not on file   Social History Narrative  . No narrative on file    Current Outpatient Prescriptions on File Prior to Visit  Medication Sig Dispense Refill  . GREEN TEA, CAMILLIA SINENSIS, PO Take by mouth.    . hyoscyamine (LEVSIN SL) 0.125 MG SL tablet     . methimazole (TAPAZOLE) 5 MG tablet Take 1 tablet (5 mg total) by mouth daily. 30 tablet 2  . ondansetron (ZOFRAN) 8 MG tablet     . ranitidine (ZANTAC) 150 MG capsule     . sertraline (ZOLOFT) 100 MG tablet Take 150 mg by mouth daily.     No current facility-administered medications on file prior to visit.     Allergies  Allergen Reactions  . Naproxen Nausea And Vomiting    Family History  Problem Relation Age of Onset  . Thyroid disease Neg Hx     BP 114/78   Pulse 95   Wt 201 lb 12.8 oz (91.5 kg)   LMP 02/28/2017   SpO2 98%   BMI 34.64 kg/m    Review of Systems She denies fever    Objective:   Physical Exam VITAL SIGNS:  See vs page GENERAL: no  distress NECK: thyroid is slightly and diffusely enlarged      Assessment & Plan:  Hyperthyroidism: due for recheck  Patient Instructions  blood tests are requested for you today.  We'll let you know about the results. If ever you have fever while taking methimazole, stop it and call us, even if the reason is obvious, because of the risk of a rare side-effect.  In view of your medical condition, you should avoid pregnancy until we have decided it is safe.   Please come back for a follow-up appointment in 4 months.

## 2017-03-15 NOTE — Patient Instructions (Addendum)
blood tests are requested for you today.  We'll let you know about the results. If ever you have fever while taking methimazole, stop it and call us, even if the reason is obvious, because of the risk of a rare side-effect.  In view of your medical condition, you should avoid pregnancy until we have decided it is safe.   Please come back for a follow-up appointment in 4 months.

## 2017-03-17 DIAGNOSIS — K589 Irritable bowel syndrome without diarrhea: Secondary | ICD-10-CM | POA: Insufficient documentation

## 2017-05-08 ENCOUNTER — Other Ambulatory Visit: Payer: Self-pay | Admitting: Physician Assistant

## 2017-05-08 DIAGNOSIS — Z1231 Encounter for screening mammogram for malignant neoplasm of breast: Secondary | ICD-10-CM

## 2017-05-26 IMAGING — CT CT ABD-PELV W/ CM
2 of 6 series · 15 of 46 positions shown, 17 images · IV contrast (APPLIED)
Comparison: None.

CLINICAL DATA: 40-year-old female with diffuse abdominal pain.

EXAM:
CT ABDOMEN AND PELVIS WITH CONTRAST
TECHNIQUE: Multidetector CT imaging of the abdomen and pelvis was performed
using the standard protocol following bolus administration of
intravenous contrast.
CONTRAST:  100mL 8GGYKH-511 IOPAMIDOL (8GGYKH-511) INJECTION 61%

[Series 2: axial st · axial · 0.89mm/px · z∈[-478,-62]mm · 12 of 95 slices shown, 14 images]
[im 6/95  soft-tissue]
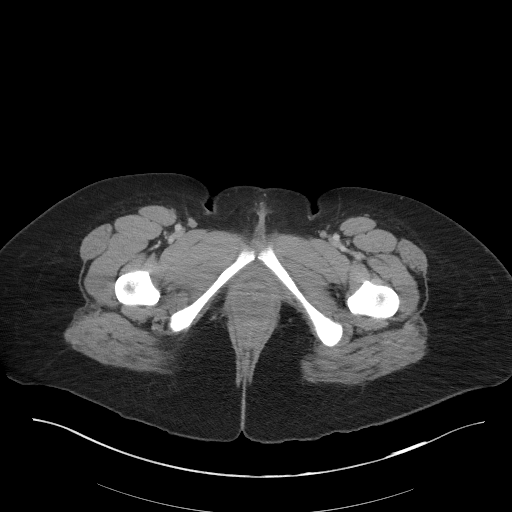
[im 6/95  bone]
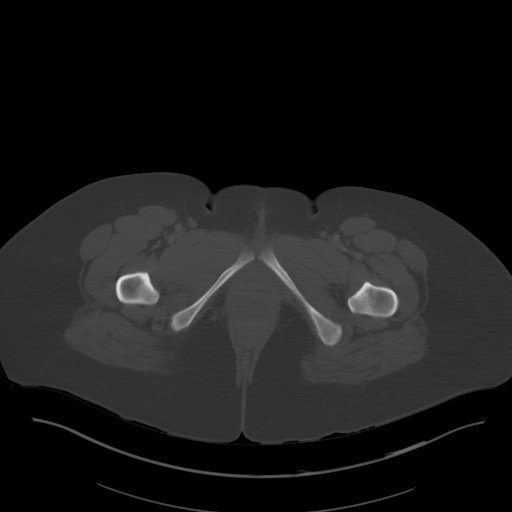
[im 16/95  soft-tissue]
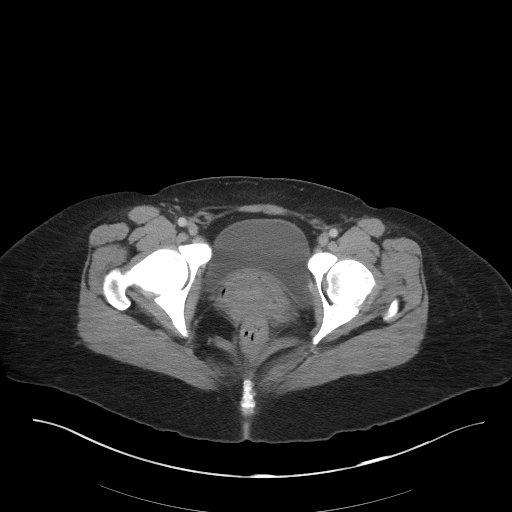
[im 21/95  soft-tissue]
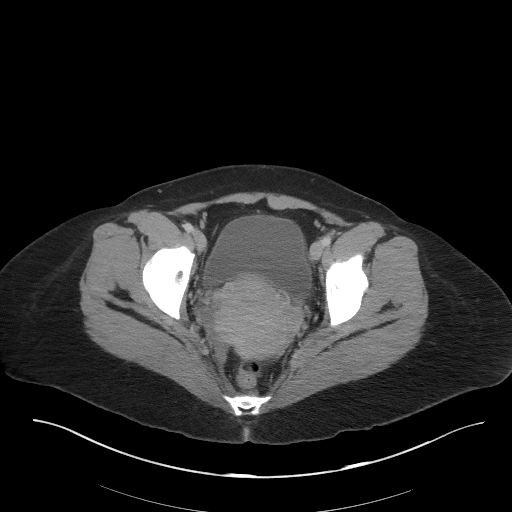
[im 27/95  soft-tissue]
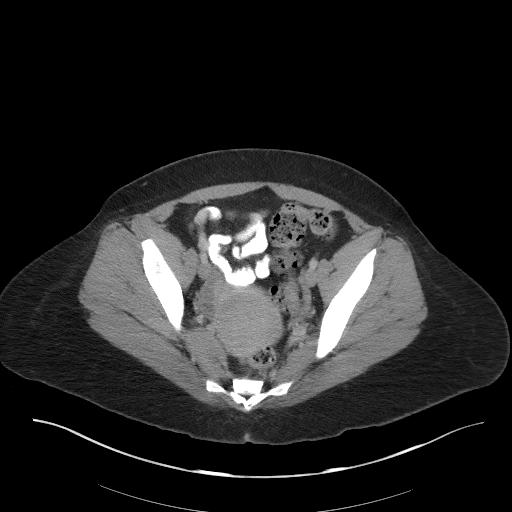
[im 37/95  soft-tissue]
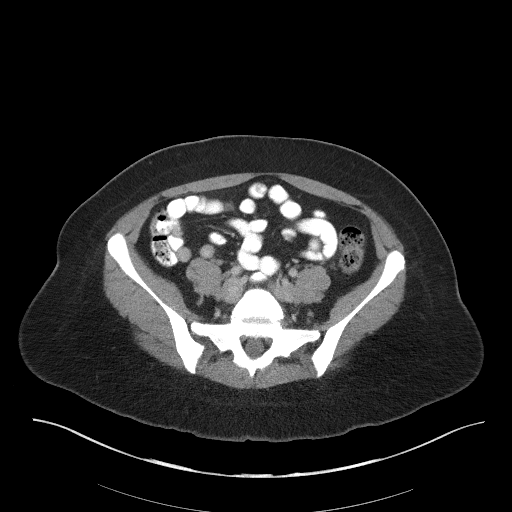
[im 42/95  soft-tissue]
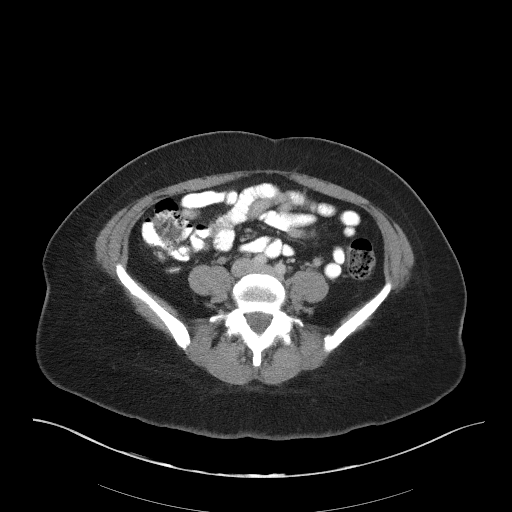
[im 53/95  soft-tissue]
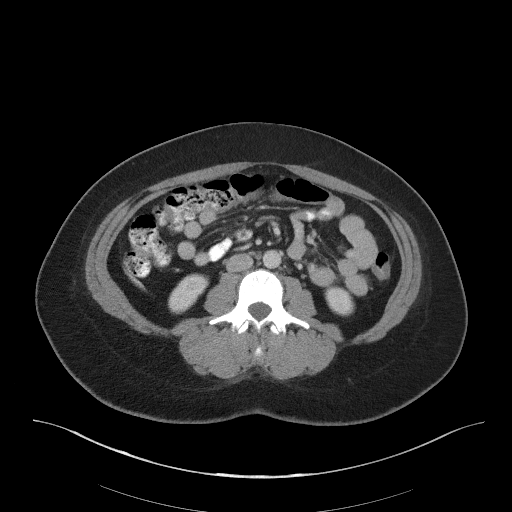
[im 58/95  soft-tissue]
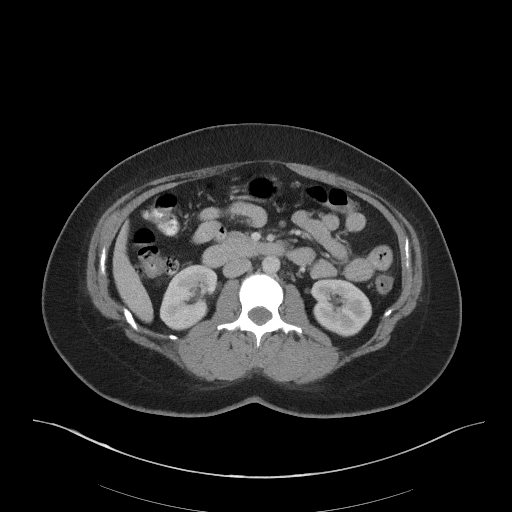
[im 68/95  soft-tissue]
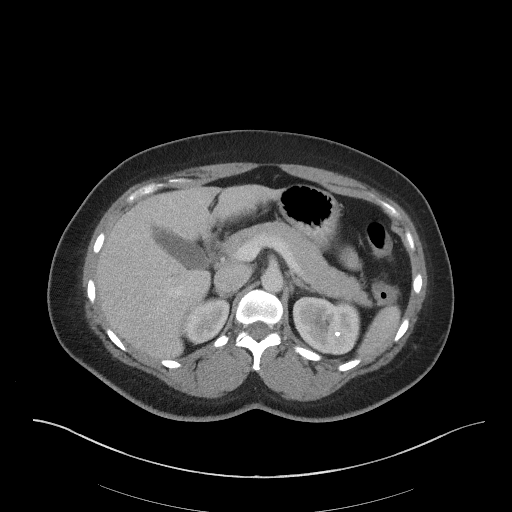
[im 68/95  bone]
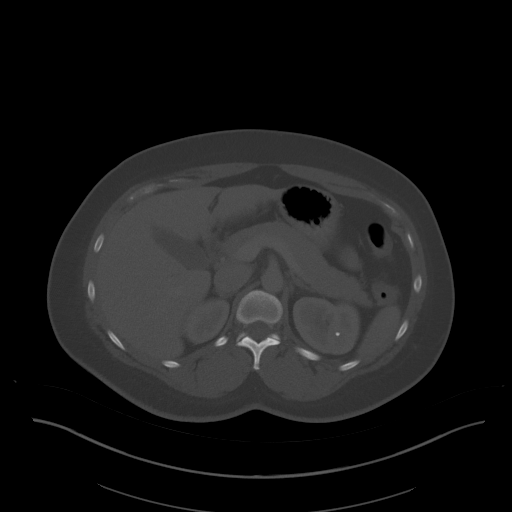
[im 74/95  soft-tissue]
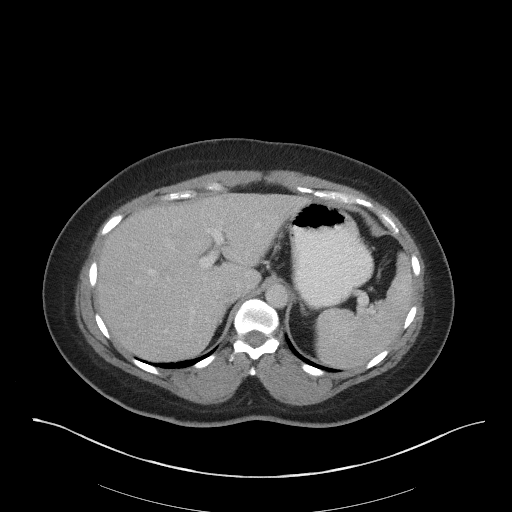
[im 79/95  soft-tissue]
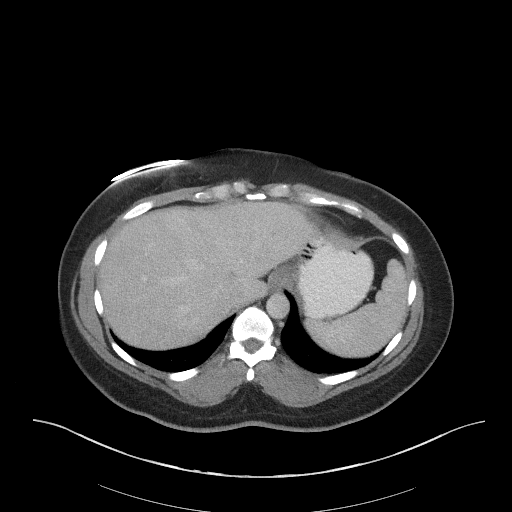
[im 89/95  soft-tissue]
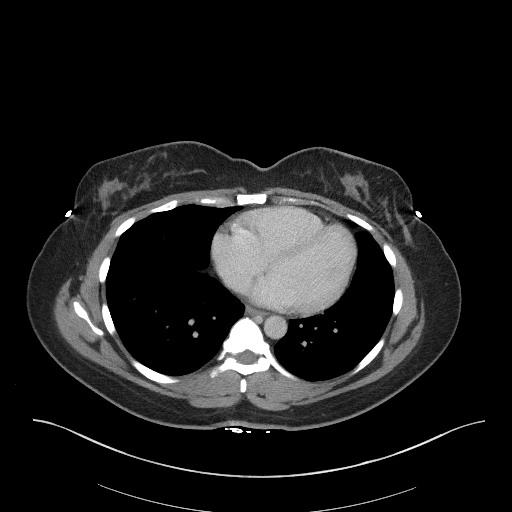

[Series 5: coronal st · coronal · 0.63mm/px · 3 of 78 slices shown]
[im 26/78  soft-tissue]
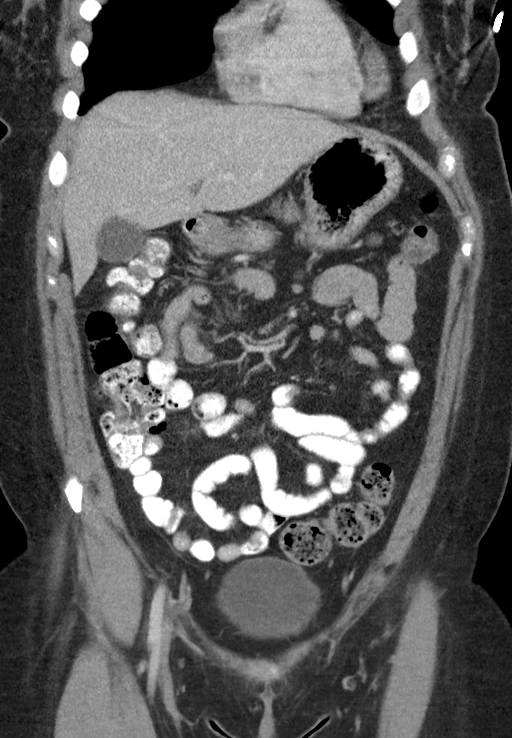
[im 35/78  soft-tissue]
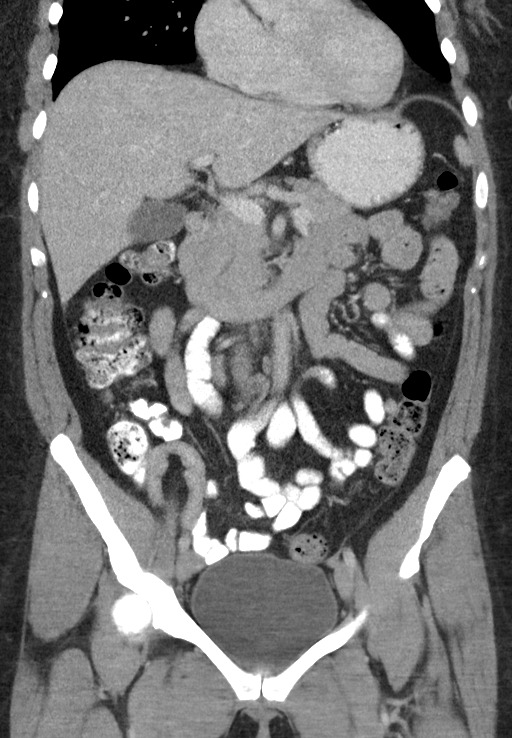
[im 43/78  soft-tissue]
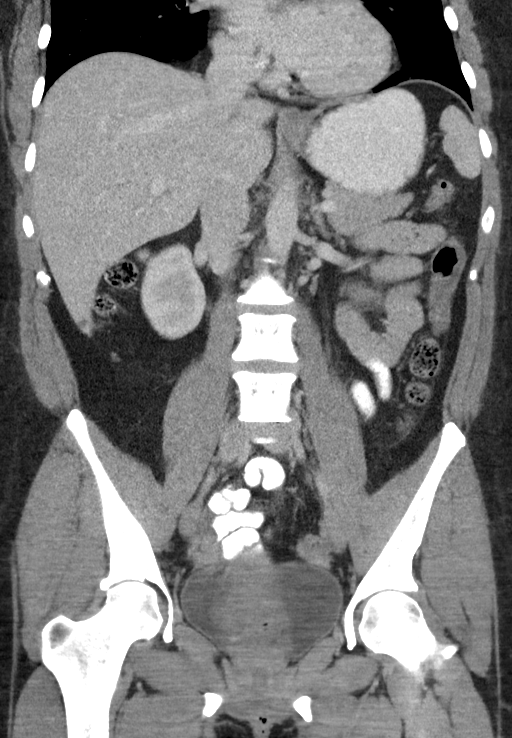

[15 of 46 positions shown; findings below may reference images not displayed]

FINDINGS: Lower chest: The visualized lung bases are clear.

No intra-abdominal free air.  Small free fluid within the pelvis.

Hepatobiliary: Subcentimeter scattered hepatic hypodense lesions are
too small to characterize but likely represents cysts or hemangioma.
No intrahepatic biliary ductal dilatation. The gallbladder is
unremarkable.

Pancreas: Unremarkable. No pancreatic ductal dilatation or
surrounding inflammatory changes.

Spleen: Normal in size without focal abnormality.

Adrenals/Urinary Tract: The adrenal glands are unremarkable. There
is a 5 mm nonobstructing left renal upper pole, possibly parenchymal
stone. No hydronephrosis. The right kidney is unremarkable. There is
symmetric uptake and excretion of contrast by the kidneys
bilaterally. The visualized ureters and urinary bladder appear
unremarkable.

Stomach/Bowel: There is segmental thickening of the transverse colon
as well as thickening of the proximal descending colon compatible
with colitis. Correlation with clinical exam and stool cultures
recommended. There is no evidence of bowel obstruction. Normal
appendix.

Vascular/Lymphatic: No significant vascular findings are present. No
enlarged abdominal or pelvic lymph nodes.

Reproductive: The uterus is retroverted. Small amount of fluid may
be present within the endometrial canal. The ovaries are grossly
unremarkable.

Other: Small fat containing umbilical hernia.

Musculoskeletal: No acute or significant osseous findings.
IMPRESSION: Colitis of the transverse and descending colon. Correlation with
clinical exam and stool cultures recommended. No bowel obstruction.
Normal appendix.

A 5 mm nonobstructing left renal upper pole stone. No
hydronephrosis.

## 2017-05-31 ENCOUNTER — Ambulatory Visit: Payer: BC Managed Care – PPO

## 2017-06-06 ENCOUNTER — Ambulatory Visit: Payer: BC Managed Care – PPO

## 2017-06-08 ENCOUNTER — Other Ambulatory Visit (HOSPITAL_COMMUNITY)
Admission: RE | Admit: 2017-06-08 | Discharge: 2017-06-08 | Disposition: A | Discharge status: Home / Self Care | Payer: BC Managed Care – PPO | Source: Ambulatory Visit | Attending: Family Medicine | Admitting: Family Medicine

## 2017-06-08 ENCOUNTER — Other Ambulatory Visit: Payer: Self-pay | Admitting: Family Medicine

## 2017-06-08 DIAGNOSIS — Z124 Encounter for screening for malignant neoplasm of cervix: Secondary | ICD-10-CM | POA: Insufficient documentation

## 2017-06-13 LAB — CYTOLOGY - PAP
Candida vaginitis: NEGATIVE
Diagnosis: NEGATIVE
HPV (WINDOPATH): DETECTED — AB

## 2017-06-30 ENCOUNTER — Ambulatory Visit
Admission: RE | Admit: 2017-06-30 | Discharge: 2017-06-30 | Disposition: A | Discharge status: Home / Self Care | Payer: BC Managed Care – PPO | Source: Ambulatory Visit | Attending: Physician Assistant | Admitting: Physician Assistant

## 2017-06-30 ENCOUNTER — Other Ambulatory Visit: Payer: Self-pay | Admitting: Family Medicine

## 2017-06-30 DIAGNOSIS — Z1231 Encounter for screening mammogram for malignant neoplasm of breast: Secondary | ICD-10-CM

## 2017-08-28 ENCOUNTER — Other Ambulatory Visit: Payer: Self-pay | Admitting: Obstetrics and Gynecology

## 2017-11-22 ENCOUNTER — Encounter (HOSPITAL_BASED_OUTPATIENT_CLINIC_OR_DEPARTMENT_OTHER): Payer: Self-pay

## 2017-11-22 ENCOUNTER — Emergency Department (HOSPITAL_BASED_OUTPATIENT_CLINIC_OR_DEPARTMENT_OTHER)
Admission: EM | Admit: 2017-11-22 | Discharge: 2017-11-22 | Disposition: A | Discharge status: Home / Self Care | Payer: BC Managed Care – PPO | Attending: Emergency Medicine | Admitting: Emergency Medicine

## 2017-11-22 ENCOUNTER — Encounter (HOSPITAL_COMMUNITY): Payer: Self-pay

## 2017-11-22 ENCOUNTER — Other Ambulatory Visit: Payer: Self-pay

## 2017-11-22 DIAGNOSIS — I252 Old myocardial infarction: Secondary | ICD-10-CM | POA: Insufficient documentation

## 2017-11-22 DIAGNOSIS — Z8673 Personal history of transient ischemic attack (TIA), and cerebral infarction without residual deficits: Secondary | ICD-10-CM | POA: Diagnosis not present

## 2017-11-22 DIAGNOSIS — M542 Cervicalgia: Secondary | ICD-10-CM | POA: Diagnosis present

## 2017-11-22 DIAGNOSIS — M62838 Other muscle spasm: Secondary | ICD-10-CM | POA: Diagnosis not present

## 2017-11-22 DIAGNOSIS — Z79899 Other long term (current) drug therapy: Secondary | ICD-10-CM | POA: Insufficient documentation

## 2017-11-22 DIAGNOSIS — E119 Type 2 diabetes mellitus without complications: Secondary | ICD-10-CM | POA: Insufficient documentation

## 2017-11-22 DIAGNOSIS — E0590 Thyrotoxicosis, unspecified without thyrotoxic crisis or storm: Secondary | ICD-10-CM | POA: Diagnosis not present

## 2017-11-22 HISTORY — DX: Nonrheumatic mitral (valve) prolapse: I34.1

## 2017-11-22 MED ORDER — CYCLOBENZAPRINE HCL 5 MG PO TABS
5.0000 mg | ORAL_TABLET | Freq: Once | ORAL | Status: AC
  Administered 2017-11-22: 5 mg via ORAL
  Filled 2017-11-22: qty 1

## 2017-11-22 MED ORDER — KETOROLAC TROMETHAMINE 30 MG/ML IJ SOLN
30.0000 mg | Freq: Once | INTRAMUSCULAR | Status: AC
  Administered 2017-11-22: 30 mg via INTRAMUSCULAR
  Filled 2017-11-22: qty 1

## 2017-11-22 MED ORDER — CYCLOBENZAPRINE HCL 5 MG PO TABS: 5.0000 mg | ORAL_TABLET | Freq: Three times a day (TID) | ORAL | 0 refills | Status: AC | PRN

## 2017-11-22 MED FILL — CYCLOBENZAPRINE 5 MG TABLET: 5 | 10 days supply | Qty: 30 | Fill #0

## 2017-11-22 NOTE — ED Provider Notes (Addendum)
[facility] EMERGENCY DEPARTMENT Provider Note  CSN: [id number] Arrival date & time: 11/22/17  1403   History   Chief Complaint Chief Complaint  Patient presents with  . Neck Pain    HPI Peggy Mahoney is a 42 y.o. female with a medical history of Type 2 DM, hyperthyroidism and IBS who presented to the ED for neck pain which stated today. She states she had a migraine last night, but was relieved with ibuprofen. Today when she was walking she experienced a sharp pain that started in her right shoulder radiated up her neck to the back of her head. She denies any falls, trauma or injuries prior to. She states she participated in tug-o-war at school during field day, but did not feel any strain after that. She is able to move her neck, but it is painful and describes it as sharp and tight. Denies paresthesias, weakness, AMS or other neuro symptoms. Denies recent sick contacts, fever, neck stiffness or rash.  Past Medical History:  Diagnosis Date  . Bronchitis   . Hyperthyroidism   . MI (myocardial infarction) ([place])   . MVP (mitral valve prolapse)   . Stroke [facility])     Patient Active Problem List   Diagnosis Date Noted  . IBS (irritable bowel syndrome) 03/17/2017  . Hyperthyroidism 10/19/2016  . Diabetes ([place])     History reviewed. No pertinent surgical history.   OB History   None      Home Medications    Prior to Admission medications   Medication Sig Start Date End Date Taking? Authorizing Provider  cyclobenzaprine (FLEXERIL) 5 MG tablet Take 1 tablet (5 mg total) by mouth 3 (three) times daily as needed for up to 14 days for muscle spasms. 11/22/17 12/06/17  Serin Thornell, Alvie [place] I, PA-C  GREEN TEA, CAMILLIA SINENSIS, PO Take by mouth.    [provider]  hyoscyamine (LEVSIN SL) 0.125 MG SL tablet  07/14/16   [provider]  methimazole (TAPAZOLE) 5 MG tablet Take 1 tablet (5 mg total) by mouth daily. 10/18/16   Renato Shin, MD  ondansetron  [facility]) 8 MG tablet  07/14/16   [provider]  ranitidine (ZANTAC) 150 MG capsule  07/14/16   [provider]  sertraline (ZOLOFT) 100 MG tablet Take 150 mg by mouth daily.    [provider]    Family History Family History  Problem Relation Age of Onset  . Thyroid disease Neg Hx     Social History Social History   Tobacco Use  . Smoking status: Never Smoker  . Smokeless tobacco: Never Used  Substance Use Topics  . Alcohol use: No  . Drug use: No     Allergies   Naproxen   Review of Systems Review of Systems  Constitutional: Negative.  Negative for chills and fever.  Eyes: Negative for visual disturbance.  Musculoskeletal: Positive for neck pain. Negative for neck stiffness.  Skin: Negative.  Negative for rash.  Neurological: Positive for headaches. Negative for dizziness, weakness, light-headedness and numbness.     Physical Exam Updated Vital Signs BP (!) 110/45 (BP Location: Left Arm)   Pulse 77   Temp 98.4 F (36.9 C) (Oral)   Resp 18   Ht 5\' 4"  (1.626 m)   Wt 89.4 kg (197 lb)   LMP 11/12/2017   SpO2 100%   BMI 33.81 kg/m   Physical Exam  Constitutional: Vital signs are normal. She appears well-developed and well-nourished. No distress.  Neck: Trachea  normal and phonation normal. Neck supple. Muscular tenderness present. No spinous process tenderness present. No Brudzinski's sign and no Kernig's sign noted.  Limited lateral flexion and rotation of neck 2/2 pain. 5/5 strength in neck, shoulders, back and upper extremities.  Cardiovascular: Normal rate, regular rhythm and normal heart sounds.  Pulmonary/Chest: Effort normal and breath sounds normal.  Neurological: She has normal strength and normal reflexes. No sensory deficit. She exhibits normal muscle tone.  Skin: Skin is warm and dry. No rash noted.  Nursing note and vitals reviewed.    ED Treatments / Results  Labs (all labs ordered are listed, but only abnormal  results are displayed) Labs Reviewed - No data to display  EKG None  Radiology No results found.  Procedures Procedures (including critical care time)  Medications Ordered in ED Medications  ketorolac (TORADOL) 30 MG/ML injection 30 mg (30 mg Intramuscular Given 11/22/17 1604)  cyclobenzaprine (FLEXERIL) tablet 5 mg (5 mg Oral Given 11/22/17 1603)    Initial Impression / Assessment and Plan / ED Course  Triage vital signs and the nursing notes have been reviewed.  Pertinent labs & imaging results that were available during care of the patient were reviewed and considered in medical decision making (see chart for details). Clinical Course as of Nov 23 1631  Wed Nov 22, 2017  1606 Received IM Toradol and Flexeril 5mg  x1 in the ED for pain relief.   [GM]  1630 Pt has decreased pain and greater ROM after Toradol and Flexeril given.   [GM]    Clinical Course User Index [GM] Gennette Shadix, Jonelle Sports, PA-C   Patient presents in no acute distress. Denies recent injury or trauma that preceded neck pain. Denies systemic s/s and negative meningeal signs on exam that would suggest infectious etiology. Imaging not indicated at this time as patient has no midline tenderness of cervical spine or bony tenderness of upper extremities. Muscle tightness and spasm felt on physical exam. Pain relieved and improvement in ROM after medications above administered. Decreased concern about nerve impingement or spinal stenosis as patient does not endorse any radiating pain down the extremities.   Final Clinical Impressions(s) / ED Diagnoses  1. Neck Muscle Spasm. Received IM Toradol and Flexeril in the ED for pain relief which patient experienced relief with. Education provided and advised to follow-up with PCP or ortho if pain persists > 4 weeks. Flexeril 5mg  TID PRN prescribed.  Dispo: Home. After thorough clinical evaluation, this patient is determined to be medically stable and can be safely discharged with the  previously mentioned treatment and/or outpatient follow-up/referral(s). At this time, there are no other apparent medical conditions that require further screening, evaluation or treatment.   Final diagnoses:  Muscle spasms of neck    ED Discharge Orders        Ordered    cyclobenzaprine (FLEXERIL) 5 MG tablet  3 times daily PRN     11/22/17 1605        Tajha Sammarco, [place] I, PA-C 11/22/17 1633    Jeovani Weisenburger, [place] I, PA-C 11/22/17 1639    Blanchie Dessert, MD 11/22/17 1652

## 2017-11-22 NOTE — ED Triage Notes (Signed)
Pt c/o pain to posterior neck that started while walking at work today-pain worse with movement right shoulder-denies injury-states she did have migraine last night-NAD-entered triage with right side of head lying on shoulder

## 2017-11-22 NOTE — Discharge Instructions (Addendum)
Follow-up with your PCP or ortho provider if pain perists > 4 weeks.  May use cold or heat compress for additional relief.

## 2017-11-22 NOTE — Patient Instructions (Addendum)
Your procedure is scheduled on:  Wednesday, June 26  Enter through the [employer] [facility] at: 8 am  Pick up the phone at the desk and dial [phone].  Call this number if you have problems the morning of surgery: [phone].  Remember: Do NOT eat food or Do NOT drink clear liquids (including water) after midnight Tuesday.  Take these medicines the morning of surgery with a SIP OF WATER: zoloft, zantac and tapazole.  Bring ProAir inhaler with you on day of surgery.  Stop herbal medications, vitamin supplements, ibuprofen/NSAIDS 1 week prior to surgery.  Do NOT wear jewelry (body piercing), metal hair clips/bobby pins, make-up, or nail polish. Do NOT wear lotions, powders, or perfumes.  You may wear deoderant. Do NOT shave for 48 hours prior to surgery. Do NOT bring valuables to the hospital. Contacts may not be worn into surgery.  Leave suitcase in car.  After surgery it may be brought to your room.  For patients admitted to the hospital, checkout time is 11:00 AM the day of discharge. Home with Husband Peggy Mahoney cell [phone].

## 2017-11-30 ENCOUNTER — Other Ambulatory Visit: Payer: Self-pay | Admitting: Obstetrics and Gynecology

## 2017-12-01 ENCOUNTER — Other Ambulatory Visit: Payer: Self-pay

## 2017-12-01 ENCOUNTER — Encounter (HOSPITAL_COMMUNITY): Payer: Self-pay

## 2017-12-01 ENCOUNTER — Encounter (HOSPITAL_COMMUNITY)
Admission: RE | Admit: 2017-12-01 | Discharge: 2017-12-01 | Disposition: A | Discharge status: Home / Self Care | Payer: BC Managed Care – PPO | Source: Ambulatory Visit | Attending: Obstetrics and Gynecology | Admitting: Obstetrics and Gynecology

## 2017-12-01 DIAGNOSIS — I498 Other specified cardiac arrhythmias: Secondary | ICD-10-CM | POA: Diagnosis not present

## 2017-12-01 DIAGNOSIS — Z01812 Encounter for preprocedural laboratory examination: Secondary | ICD-10-CM | POA: Diagnosis present

## 2017-12-01 DIAGNOSIS — R9431 Abnormal electrocardiogram [ECG] [EKG]: Secondary | ICD-10-CM | POA: Insufficient documentation

## 2017-12-01 DIAGNOSIS — I252 Old myocardial infarction: Secondary | ICD-10-CM | POA: Diagnosis not present

## 2017-12-01 DIAGNOSIS — Z01810 Encounter for preprocedural cardiovascular examination: Secondary | ICD-10-CM | POA: Insufficient documentation

## 2017-12-01 HISTORY — DX: Hypothyroidism, unspecified: E03.9

## 2017-12-01 HISTORY — DX: Anxiety disorder, unspecified: F41.9

## 2017-12-01 HISTORY — DX: Anemia, unspecified: D64.9

## 2017-12-01 HISTORY — DX: Gastro-esophageal reflux disease without esophagitis: K21.9

## 2017-12-01 HISTORY — DX: Noninfective gastroenteritis and colitis, unspecified: K52.9

## 2017-12-01 LAB — CBC
HEMATOCRIT: 38 % (ref 36.0–46.0)
Hemoglobin: 11.7 g/dL — ABNORMAL LOW (ref 12.0–15.0)
MCH: 23.4 pg — ABNORMAL LOW (ref 26.0–34.0)
MCHC: 30.8 g/dL (ref 30.0–36.0)
MCV: 76.2 fL — AB (ref 78.0–100.0)
Platelets: 270 10*3/uL (ref 150–400)
RBC: 4.99 MIL/uL (ref 3.87–5.11)
RDW: 16.1 % — ABNORMAL HIGH (ref 11.5–15.5)
WBC: 7.8 10*3/uL (ref 4.0–10.5)

## 2017-12-01 LAB — TYPE AND SCREEN
ABO/RH(D): A POS
Antibody Screen: NEGATIVE

## 2017-12-01 LAB — ABO/RH: ABO/RH(D): A POS

## 2017-12-01 NOTE — Pre-Procedure Instructions (Addendum)
Patient has a history of MI 11/08/2006 and stroke 01/04/2007.  No medications, no problems since 12/2006.  Per Dr. Sundra Aland request, patient has an appointment with Dr Landry Corporal, Cardiologist on Thursday, 12/07/17 at 2 pm to be cleared for surgery.  Will place on patient's chart.

## 2017-12-08 NOTE — Pre-Procedure Instructions (Signed)
Followed up with Dr Thurman Coyer office for cardiac clearance. Spoke with Juliann Pulse.  She is faxing [place] clearance note.

## 2017-12-12 ENCOUNTER — Other Ambulatory Visit: Payer: Self-pay | Admitting: Obstetrics and Gynecology

## 2017-12-12 NOTE — H&P (Signed)
Subjective: Chief Complaint(s):   Heavy menses and fibroids   HPI:  General 42 y/o presents for preop history and physical exam in preparation for vaginal hysterectomy. she has menorrhagia with irregular menses and fibroids. she reports bleeding for 3 weeks at a time. it will start off light and then become heavy. on your heaviest day she is changing once every 2 hours. she wears a super plus tampon and a pad. she has to change both. she has 4 days that are heavy out of 3 wks. she bleeds 21 days out of a months. this has been occurring for the last 2 years. Her ultrasound shows 9 cm x 7 cm x 6 cm uterus. 1.5 cm fibroid on the right. and posterior 3.2 cm fibroid seen. the ovaries are normal bilaterally. The enodmetrium is trilayered 7.8 mm.  Endometrial biopsy 08/28/2017 was benign. Current Medication: Taking  Advair HFA(Fluticasone-Salmeterol) 115-21 MCG/ACT Aerosol 2 puffs Inhalation Twice a day     ProAir HFA(Albuterol Sulfate HFA) 108 (90 Base) MCG/ACT Aerosol Solution 2 puffs as needed Inhalation every 6 hrs, Notes: WHARTON     Ranitidine HCl 150 MG Tablet 1 tablet at bedtime Orally Once a day     Ondansetron HCl 8 MG Tablet 1 tablet as needed for nausea Orally Every 6 hours, Notes: WHARTON     Hyoscyamine Sulfate 0.125 MG Tablet Sublingual 1 tablet under the tongue and allow to dissolve as needed for cramping Sublingual every 4 hrs, Notes: WHARTON     Cranberry Plus Vitamin C(Cranberry) . Capsule as directed Orally 450 mg     Iron 325 (65 Fe) MG Tablet 1 tablet Orally Once a day   Not-Taking  Methimazole 5 MG Tablet TAKE 1 TABLET BY MOUTH EVERY DAY Oral     Tramadol HCl 50 MG Tablet 1 tablet as needed Orally every 8 hrs   Discontinued  Zoloft(Sertraline HCl) 100 MG Tablet 1 tablet Orally Once a day, taken with 50mg      Zoloft(Sertraline HCl) 50 MG Tablet 1 tablet Orally Once a day, taken with 100mg      Medication List reviewed and reconciled with the patient   Medical History:  MVP     MI 11/08/2006     Stroke 01/04/2007     hyperthyroidism-[place]     recurrent GERD, colitis -- declined GI f/up     Cardiologist-Dr. Buford Dresser ?      Allergies/Intolerance:  Naproxen: Side Effects - stomach upset   Gyn History:  Sexual activity currently sexually active. Periods : every month. Birth control none. Last pap smear date 06/08/17-negative, positive for HPV. Last mammogram date 06/30/17-normal. Denies Abnormal pap smear. Denies STD.   OB History:  Number of pregnancies 3. abortion 3. Pregnancy # 1 live birth, vaginal delivery, boy. Pregnancy # 2 live birth, vaginal delivery, boy. Pregnancy # 3 live birth, vaginal delivery, girl.   Surgical History:  wisdom teeth extractions   Hospitalization:  heart attack--Mitral valve prolapse 10/2006     stroke 12/2006     colitis 12/2-17     childbirth x 3 vaginal   Family History:  Father: alive 19 yrs, glaucoma, arthritis, diagnosed with Hypertension, CVA, CHF (congestive heart failure), Atrial fibrillation, Prostate CA    Mother: alive 10 yrs, glaucoma, arthritis, hearing impairment, cataracts, Diabetes, Hypertension    Paternal [place] Father: deceased    Paternal [place] Mother: deceased    Maternal Grand Father: deceased    Maternal Grand Mother: deceased    Brother 1: alive 58 yrs,  gum disease    Brother2: alive 38 yrs, A + W    Sister 1: alive 71 yrs, hypergycemia, Cervical Cancer at 42 yoa, Hypertension    2 brother(s) , 1 sister(s) . 2 son(s) , 1 daughter(s) .    denies any GYN family cancer hx.  Social History: General Tobacco use cigarettes: Never smoked, Tobacco history last updated 11/27/2017.  no EXPOSURE TO PASSIVE SMOKE.  Alcohol: yes, wine occasionally.  no Caffeine, once a week.  no Recreational drug use.  no Exercise.  Marital Status: married, Divorced, remarried Sept 2018.  Children: 2 sons, 1 daughter.  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   ROS: CONSTITUTIONAL No"  options="no,yes" propid="91" itemid="193425" categoryid="10464" encounterid="[id number]"Chills No. No" options="no,yes" propid="91" itemid="172899" categoryid="10464" encounterid="[id number]"Fatigue No. No" options="no,yes" propid="91" itemid="10467" categoryid="10464" encounterid="[id number]"Fever No. No" options="no,yes" propid="91" itemid="193426" categoryid="10464" encounterid="[id number]"Night sweats No. No" options="no,yes" propid="91" itemid="444261" categoryid="10464" encounterid="[id number]"Recent travel outside [place] No. No" options="no,yes" propid="91" itemid="193427" categoryid="10464" encounterid="[id number]"Sweats No. No" options="no,yes" propid="91" itemid="194825" categoryid="10464" encounterid="[id number]"Weight change No.  OPHTHALMOLOGY no" options="no,yes" propid="91" itemid="12520" categoryid="12516" encounterid="[id number]"Blurring of vision no. no" options="no,yes" propid="91" itemid="193469" categoryid="12516" encounterid="[id number]"Change in vision no. no" options="no,yes" propid="91" itemid="194379" categoryid="12516" encounterid="[id number]"Double vision no.  ENT no" options="no,yes" propid="91" itemid="193612" categoryid="10481" encounterid="[id number]"Dizziness no. Nose bleeds no. Sore throat no. Teeth pain no.  ALLERGY no" options="no,yes" propid="91" itemid="202589" categoryid="138152" encounterid="[id number]"Hives no.  CARDIOLOGY no" options="no,yes" propid="91" itemid="193603" categoryid="10488" encounterid="[id number]"Chest pain no. no" options="no,yes" propid="91" itemid="199089" categoryid="10488" encounterid="[id number]"High blood pressure no. no" options="no,yes" propid="91" itemid="202598" categoryid="10488" encounterid="[id number]"Irregular heart beat no. no" options="no,yes" propid="91" itemid="10491" categoryid="10488" encounterid="[id number]"Leg edema no. no" options="no,yes" propid="91" itemid="10490" categoryid="10488" encounterid="[id number]"Palpitations no.  RESPIRATORY no" options="no"  propid="91" itemid="270013" categoryid="138132" encounterid="[id number]"Shortness of breath no. no" options="no,yes" propid="91" itemid="172745" categoryid="138132" encounterid="[id number]"Cough no. no" options="no,yes" propid="91" itemid="193621" categoryid="138132" encounterid="[id number]"Wheezing no.  UROLOGY no" options="no,yes" propid="91" itemid="194377" categoryid="138166" encounterid="[id number]"Pain with urination no. no" options="no,yes" propid="91" itemid="193493" categoryid="138166" encounterid="[id number]"Urinary urgency no. no" options="no,yes" propid="91" itemid="193492" categoryid="138166" encounterid="[id number]"Urinary frequency no. no" options="no,yes" propid="91" itemid="138171" categoryid="138166" encounterid="[id number]"Urinary incontinence no. No" options="no,yes" propid="91" itemid="138167" categoryid="138166" encounterid="[id number]"Difficulty urinating No. No" options="no,yes" propid="91" itemid="138168" categoryid="138166" encounterid="[id number]"Blood in urine No.  GASTROENTEROLOGY no" options="no,yes" propid="91" itemid="10496" categoryid="10494" encounterid="[id number]"Abdominal pain no. no" options="no,yes" propid="91" itemid="193447" categoryid="10494" encounterid="[id number]"Appetite change no. no" options="no,yes" propid="91" itemid="193448" categoryid="10494" encounterid="[id number]"Bloating/belching no. no" options="no,yes" propid="91" itemid="10503" categoryid="10494" encounterid="[id number]"Blood in stool or on toilet paper no. no" options="no,yes" propid="91" itemid="199106" categoryid="10494" encounterid="[id number]"Change in bowel movements no. no" options="no,yes" propid="91" itemid="10501" categoryid="10494" encounterid="[id number]"Constipation no. no" options="no,yes" propid="91" itemid="10502" categoryid="10494" encounterid="[id number]"Diarrhea no. no" options="no,yes" propid="91" itemid="199104" categoryid="10494" encounterid="[id number]"Difficulty swallowing no. no" options="no,yes" propid="91"  itemid="10499" categoryid="10494" encounterid="[id number]"Nausea no.  FEMALE REPRODUCTIVE no" options="no,yes" propid="91" itemid="453725" categoryid="10525" encounterid="[id number]"Vulvar pain no. no" options="no,yes" propid="91" itemid="453726" categoryid="10525" encounterid="[id number]"Vulvar rash no. yes" options="no, yes" propid="91" itemid="444315" categoryid="10525" encounterid="[id number]"Abnormal vaginal bleeding yes. no" options="no,yes" propid="91" itemid="186083" categoryid="10525" encounterid="[id number]"Breast pain no. no" options="no,yes" propid="91" itemid="186084" categoryid="10525" encounterid="[id number]"Nipple discharge no. no" options="no,yes" propid="91" itemid="275823" categoryid="10525" encounterid="[id number]"Pain with intercourse no. no" options="no,yes" propid="91" itemid="186082" categoryid="10525" encounterid="[id number]"Pelvic pain no. no" options="no,yes" propid="91" itemid="278230" categoryid="10525" encounterid="[id number]"Unusual vaginal discharge no. no" options="no,yes" propid="91" itemid="278942" categoryid="10525" encounterid="[id number]"Vaginal itching no.  MUSCULOSKELETAL no" options="no,yes" propid="91" itemid="193461" categoryid="10514" encounterid="[id number]"Muscle aches no.  NEUROLOGY no" options="no,yes" propid="91" itemid="12513" categoryid="12512" encounterid="[id number]"Headache no. no" options="no,yes" propid="91" itemid="12514" categoryid="12512" encounterid="[id number]"Tingling/numbness no. no" options="no,yes" propid="91" itemid="193468" categoryid="12512" encounterid="[id number]"Weakness no.  PSYCHOLOGY no" options="" propid="91" itemid="275919" categoryid="10520" encounterid="[id number]"Depression no. no" options="no,yes" propid="91" itemid="172748" categoryid="10520" encounterid="[id number]"Anxiety no. no" options="no,yes" propid="91" itemid="199158" categoryid="10520" encounterid="[id number]"Nervousness no. no" options="no,yes" propid="91" itemid="12502" categoryid="10520"  encounterid="[id number]"Sleep disturbances no. no " options="no,yes" propid="91" itemid="72718" categoryid="10520" encounterid="[id number]"Suicidal ideation no .  ENDOCRINOLOGY no" options="no,yes" propid="91" itemid="194628" categoryid="12508" encounterid="[id number]"Excessive thirst no. no" options="no,yes" propid="91" itemid="196285" categoryid="12508" encounterid="[id number]"Excessive urination no. no" options="no, yes" propid="91" ZYSAYT="016010"  categoryid="12508" encounterid="[id number]"Hair loss no. no" options="" propid="91" itemid="447284" categoryid="12508" encounterid="[id number]"Heat or cold intolerance no.  HEMATOLOGY/LYMPH no" options="no,yes" propid="91" itemid="199152" categoryid="138157" encounterid="[id number]"Abnormal bleeding no. no" options="no,yes" propid="91" itemid="170653" categoryid="138157" encounterid="[id number]"Easy bruising no. no" options="no,yes" propid="91" itemid="138158" categoryid="138157" encounterid="[id number]"Swollen glands no.  DERMATOLOGY no" options="no,yes" propid="91" itemid="199126" categoryid="12503" encounterid="[id number]"New/changing skin lesion no. no" options="no,yes" propid="91" itemid="12504" categoryid="12503" encounterid="[id number]"Rash no. no" options="" propid="91" itemid="444313" categoryid="12503" encounterid="[id number]"Sores no.   Negative except as stated in HPI.  Objective: Vitals: Wt 202, Wt change 1 lb, Ht 64.75, BMI 33.87, Pulse sitting 86, BP sitting 110/70, LMP: 11/12/17  Past Results: Examination:  General Examination alert, oriented, NAD " categoryPropId="10089" examid="193638"CONSTITUTIONAL: alert, oriented, NAD .  moist, warm" categoryPropId="10109" examid="193638"SKIN: moist, warm.  Conjunctiva clear" categoryPropId="21468" examid="193638"EYES: Conjunctiva clear.  clear to auscultation bilaterally" categoryPropId="87" examid="193638"LUNGS: clear to auscultation bilaterally.  regular rate and rhythm" categoryPropId="86" examid="193638"HEART:  regular rate and rhythm.  soft, non-tender/non-distended, bowel sounds present " categoryPropId="88" examid="193638"ABDOMEN: soft, non-tender/non-distended, bowel sounds present .  normal external genitalia, labia - unremarkable, vagina - pink moist mucosa, no lesions or abnormal discharge, cervix - no discharge or lesions or CMT, adnexa - no masses or tenderness, uterus - nontender and normal size on palpation " categoryPropId="13414" examid="193638"FEMALE GENITOURINARY: normal external genitalia, labia - unremarkable, vagina - pink moist mucosa, no lesions or abnormal discharge, cervix - no discharge or lesions or CMT, adnexa - no masses or tenderness, uterus - nontender and normal size on palpation .  affect normal, good eye contact" categoryPropId="16316" examid="193638"PSYCH: affect normal, good eye contact.  Physical Examination:    Assessment: Assessment:  Menorrhagia with irregular cycle - N92.1 (Primary)     Fibroids - D21.9     History of MI (myocardial infarction) - I25.2     Plan: Treatment: Menorrhagia with irregular cycle Notes: patient desires definitive therapy via a hysterectomy.. plan vaginal hysterectomy with bilateral salpingectomy. I discussed with the pt r/b/a of hysterectomy including but not limited to infection/ bleeding damage to bowel bladder ureters and surrounding organs with the need for further surgery. risk of conversion to abdominal sugery discussed. ... r/o transfustion hiv/ hep B&C discussed.. Fibroids Notes: patient desires definitive therapy via a hysterectomy.. plan vaginal hysterectomy with bilateral salpingectomy. I discussed with the pt r/b/a of hysterectomy including but not limited to infection/ bleeding damage to bowel bladder ureters and surrounding organs with the need for further surgery. risk of conversion to abdominal sugery discussed. ... r/o transfustion hiv/ hep B&C discussed.Marland Kitchen History of MI (myocardial infarction) Notes: surgical clearance  from cardiology is needed prior to surgery. pt plans to make appt iwth cardiology to obtain this she will call with the name of her cardiologist as she is unsure who she has seen.

## 2017-12-13 ENCOUNTER — Observation Stay (HOSPITAL_COMMUNITY)
Admission: AD | Admit: 2017-12-13 | Discharge: 2017-12-14 | Disposition: A | Discharge status: Home / Self Care | Payer: BC Managed Care – PPO | Source: Ambulatory Visit | Attending: Obstetrics and Gynecology | Admitting: Obstetrics and Gynecology

## 2017-12-13 ENCOUNTER — Ambulatory Visit (HOSPITAL_COMMUNITY): Payer: BC Managed Care – PPO

## 2017-12-13 ENCOUNTER — Encounter (HOSPITAL_COMMUNITY)
Admission: AD | Disposition: A | Discharge status: Home / Self Care | Payer: Self-pay | Source: Ambulatory Visit | Attending: Obstetrics and Gynecology

## 2017-12-13 ENCOUNTER — Other Ambulatory Visit: Payer: Self-pay

## 2017-12-13 ENCOUNTER — Encounter (HOSPITAL_COMMUNITY): Payer: Self-pay | Admitting: Anesthesiology

## 2017-12-13 DIAGNOSIS — N800 Endometriosis of uterus: Principal | ICD-10-CM | POA: Insufficient documentation

## 2017-12-13 DIAGNOSIS — D219 Benign neoplasm of connective and other soft tissue, unspecified: Secondary | ICD-10-CM | POA: Diagnosis present

## 2017-12-13 DIAGNOSIS — Z90710 Acquired absence of both cervix and uterus: Secondary | ICD-10-CM | POA: Diagnosis present

## 2017-12-13 DIAGNOSIS — N920 Excessive and frequent menstruation with regular cycle: Secondary | ICD-10-CM | POA: Diagnosis not present

## 2017-12-13 DIAGNOSIS — D251 Intramural leiomyoma of uterus: Secondary | ICD-10-CM | POA: Diagnosis not present

## 2017-12-13 HISTORY — PX: VAGINAL HYSTERECTOMY: SHX2639

## 2017-12-13 LAB — PREGNANCY, URINE: Preg Test, Ur: NEGATIVE

## 2017-12-13 SURGERY — HYSTERECTOMY, VAGINAL
Anesthesia: General | Site: Vagina | Laterality: Bilateral

## 2017-12-13 MED ORDER — OXYCODONE-ACETAMINOPHEN 5-325 MG PO TABS
2.0000 | ORAL_TABLET | ORAL | Status: DC | PRN
  Administered 2017-12-13 – 2017-12-14 (×2): 2 via ORAL
  Filled 2017-12-13 (×2): qty 2

## 2017-12-13 MED ORDER — HYDROMORPHONE HCL 1 MG/ML IJ SOLN
0.2500 mg | INTRAMUSCULAR | Status: DC | PRN
  Administered 2017-12-13 (×4): 0.5 mg via INTRAVENOUS

## 2017-12-13 MED ORDER — CEFAZOLIN SODIUM-DEXTROSE 2-4 GM/100ML-% IV SOLN
INTRAVENOUS | Status: AC
  Filled 2017-12-13: qty 100

## 2017-12-13 MED ORDER — SIMETHICONE 80 MG PO CHEW: 80.0000 mg | CHEWABLE_TABLET | Freq: Four times a day (QID) | ORAL | Status: DC | PRN

## 2017-12-13 MED ORDER — LIDOCAINE HCL (CARDIAC) PF 100 MG/5ML IV SOSY
PREFILLED_SYRINGE | INTRAVENOUS | Status: AC
  Filled 2017-12-13: qty 5

## 2017-12-13 MED ORDER — FENTANYL CITRATE (PF) 250 MCG/5ML IJ SOLN
INTRAMUSCULAR | Status: AC
Start: 2017-12-13 — End: ?
  Filled 2017-12-13: qty 5

## 2017-12-13 MED ORDER — ONDANSETRON HCL 4 MG PO TABS: 4.0000 mg | ORAL_TABLET | Freq: Four times a day (QID) | ORAL | Status: DC | PRN

## 2017-12-13 MED ORDER — LIDOCAINE HCL (CARDIAC) PF 100 MG/5ML IV SOSY
PREFILLED_SYRINGE | INTRAVENOUS | Status: DC | PRN
  Administered 2017-12-13: 100 mg via INTRAVENOUS

## 2017-12-13 MED ORDER — OXYCODONE-ACETAMINOPHEN 5-325 MG PO TABS: 1.0000 | ORAL_TABLET | ORAL | Status: DC | PRN

## 2017-12-13 MED ORDER — LACTATED RINGERS IV SOLN
INTRAVENOUS | Status: DC
  Administered 2017-12-13 – 2017-12-14 (×4): via INTRAVENOUS

## 2017-12-13 MED ORDER — HYDROMORPHONE HCL 1 MG/ML IJ SOLN
0.2000 mg | INTRAMUSCULAR | Status: DC | PRN
  Administered 2017-12-13: 0.5 mg via INTRAVENOUS
  Administered 2017-12-13: 0.6 mg via INTRAVENOUS
  Filled 2017-12-13 (×2): qty 1

## 2017-12-13 MED ORDER — LIDOCAINE-EPINEPHRINE 1 %-1:100000 IJ SOLN
INTRAMUSCULAR | Status: AC
  Filled 2017-12-13: qty 2

## 2017-12-13 MED ORDER — CEFAZOLIN SODIUM-DEXTROSE 2-4 GM/100ML-% IV SOLN
2.0000 g | INTRAVENOUS | Status: AC
  Administered 2017-12-13: 2 g via INTRAVENOUS

## 2017-12-13 MED ORDER — DEXAMETHASONE SODIUM PHOSPHATE 10 MG/ML IJ SOLN
INTRAMUSCULAR | Status: DC | PRN
  Administered 2017-12-13: 4 mg via INTRAVENOUS

## 2017-12-13 MED ORDER — MEPERIDINE HCL 25 MG/ML IJ SOLN: 6.2500 mg | INTRAMUSCULAR | Status: DC | PRN

## 2017-12-13 MED ORDER — MENTHOL 3 MG MT LOZG: 1.0000 | LOZENGE | OROMUCOSAL | Status: DC | PRN

## 2017-12-13 MED ORDER — ONDANSETRON HCL 4 MG/2ML IJ SOLN
INTRAMUSCULAR | Status: AC
  Filled 2017-12-13: qty 2

## 2017-12-13 MED ORDER — SCOPOLAMINE 1 MG/3DAYS TD PT72
MEDICATED_PATCH | TRANSDERMAL | Status: AC
  Filled 2017-12-13: qty 1

## 2017-12-13 MED ORDER — KETOROLAC TROMETHAMINE 30 MG/ML IJ SOLN
30.0000 mg | Freq: Four times a day (QID) | INTRAMUSCULAR | Status: DC
  Administered 2017-12-13 – 2017-12-14 (×3): 30 mg via INTRAVENOUS
  Filled 2017-12-13 (×3): qty 1

## 2017-12-13 MED ORDER — FENTANYL CITRATE (PF) 100 MCG/2ML IJ SOLN
INTRAMUSCULAR | Status: DC | PRN
  Administered 2017-12-13: 100 ug via INTRAVENOUS
  Administered 2017-12-13: 50 ug via INTRAVENOUS
  Administered 2017-12-13: 100 ug via INTRAVENOUS

## 2017-12-13 MED ORDER — HYDROMORPHONE HCL 1 MG/ML IJ SOLN
INTRAMUSCULAR | Status: DC | PRN
  Administered 2017-12-13: 1 mg via INTRAVENOUS

## 2017-12-13 MED ORDER — FAMOTIDINE IN NACL 20-0.9 MG/50ML-% IV SOLN
20.0000 mg | Freq: Two times a day (BID) | INTRAVENOUS | Status: DC
  Administered 2017-12-13 (×2): 20 mg via INTRAVENOUS
  Filled 2017-12-13 (×3): qty 50

## 2017-12-13 MED ORDER — ROCURONIUM BROMIDE 100 MG/10ML IV SOLN
INTRAVENOUS | Status: AC
  Filled 2017-12-13: qty 1

## 2017-12-13 MED ORDER — SUGAMMADEX SODIUM 200 MG/2ML IV SOLN
INTRAVENOUS | Status: DC | PRN
  Administered 2017-12-13: 184.4 mg via INTRAVENOUS

## 2017-12-13 MED ORDER — SCOPOLAMINE 1 MG/3DAYS TD PT72
1.0000 | MEDICATED_PATCH | Freq: Once | TRANSDERMAL | Status: DC
  Administered 2017-12-13: 1.5 mg via TRANSDERMAL

## 2017-12-13 MED ORDER — ESTRADIOL 0.1 MG/GM VA CREA
TOPICAL_CREAM | VAGINAL | Status: DC | PRN
  Administered 2017-12-13: 1 via VAGINAL

## 2017-12-13 MED ORDER — MIDAZOLAM HCL 2 MG/2ML IJ SOLN
INTRAMUSCULAR | Status: AC
  Filled 2017-12-13: qty 2

## 2017-12-13 MED ORDER — ACETAMINOPHEN 10 MG/ML IV SOLN
INTRAVENOUS | Status: AC
Start: 2017-12-13 — End: 2017-12-13
  Filled 2017-12-13: qty 100

## 2017-12-13 MED ORDER — PROPOFOL 10 MG/ML IV BOLUS
INTRAVENOUS | Status: AC
  Filled 2017-12-13: qty 20

## 2017-12-13 MED ORDER — KETOROLAC TROMETHAMINE 30 MG/ML IJ SOLN: 30.0000 mg | Freq: Four times a day (QID) | INTRAMUSCULAR | Status: DC

## 2017-12-13 MED ORDER — ROCURONIUM BROMIDE 100 MG/10ML IV SOLN
INTRAVENOUS | Status: DC | PRN
  Administered 2017-12-13: 40 mg via INTRAVENOUS

## 2017-12-13 MED ORDER — ALBUTEROL SULFATE (2.5 MG/3ML) 0.083% IN NEBU: 3.0000 mL | INHALATION_SOLUTION | Freq: Four times a day (QID) | RESPIRATORY_TRACT | Status: DC | PRN

## 2017-12-13 MED ORDER — ONDANSETRON HCL 4 MG/2ML IJ SOLN
INTRAMUSCULAR | Status: DC | PRN
  Administered 2017-12-13: 4 mg via INTRAVENOUS

## 2017-12-13 MED ORDER — KETOROLAC TROMETHAMINE 30 MG/ML IJ SOLN
INTRAMUSCULAR | Status: AC
  Filled 2017-12-13: qty 1

## 2017-12-13 MED ORDER — DEXAMETHASONE SODIUM PHOSPHATE 4 MG/ML IJ SOLN
INTRAMUSCULAR | Status: AC
  Filled 2017-12-13: qty 1

## 2017-12-13 MED ORDER — KETOROLAC TROMETHAMINE 30 MG/ML IJ SOLN
30.0000 mg | Freq: Once | INTRAMUSCULAR | Status: AC | PRN
  Administered 2017-12-13: 30 mg via INTRAVENOUS

## 2017-12-13 MED ORDER — ACETAMINOPHEN 10 MG/ML IV SOLN
INTRAVENOUS | Status: DC | PRN
  Administered 2017-12-13: 1000 mg via INTRAVENOUS

## 2017-12-13 MED ORDER — HYDROMORPHONE HCL 1 MG/ML IJ SOLN
INTRAMUSCULAR | Status: AC
  Filled 2017-12-13: qty 1

## 2017-12-13 MED ORDER — HYDROMORPHONE HCL 1 MG/ML IJ SOLN
INTRAMUSCULAR | Status: AC
  Administered 2017-12-13: 0.5 mg via INTRAVENOUS
  Filled 2017-12-13: qty 0.5

## 2017-12-13 MED ORDER — MIDAZOLAM HCL 2 MG/2ML IJ SOLN
INTRAMUSCULAR | Status: DC | PRN
  Administered 2017-12-13: 2 mg via INTRAVENOUS

## 2017-12-13 MED ORDER — ESTRADIOL 0.1 MG/GM VA CREA
TOPICAL_CREAM | VAGINAL | Status: AC
  Filled 2017-12-13: qty 42.5

## 2017-12-13 MED ORDER — ZOLPIDEM TARTRATE 5 MG PO TABS: 5.0000 mg | ORAL_TABLET | Freq: Every evening | ORAL | Status: DC | PRN

## 2017-12-13 MED ORDER — ALBUTEROL SULFATE HFA 108 (90 BASE) MCG/ACT IN AERS
INHALATION_SPRAY | RESPIRATORY_TRACT | Status: AC
  Filled 2017-12-13: qty 6.7

## 2017-12-13 MED ORDER — PROPOFOL 10 MG/ML IV BOLUS
INTRAVENOUS | Status: DC | PRN
  Administered 2017-12-13: 200 mg via INTRAVENOUS

## 2017-12-13 MED ORDER — LIDOCAINE-EPINEPHRINE 1 %-1:100000 IJ SOLN
INTRAMUSCULAR | Status: DC | PRN
  Administered 2017-12-13: 20 mL

## 2017-12-13 MED ORDER — PROMETHAZINE HCL 25 MG/ML IJ SOLN: 6.2500 mg | INTRAMUSCULAR | Status: DC | PRN

## 2017-12-13 MED ORDER — ONDANSETRON HCL 4 MG/2ML IJ SOLN: 4.0000 mg | Freq: Four times a day (QID) | INTRAMUSCULAR | Status: DC | PRN

## 2017-12-13 MED ORDER — SENNA 8.6 MG PO TABS
1.0000 | ORAL_TABLET | Freq: Two times a day (BID) | ORAL | Status: DC
  Administered 2017-12-13 – 2017-12-14 (×2): 8.6 mg via ORAL
  Filled 2017-12-13 (×3): qty 1

## 2017-12-13 MED ORDER — ALUM & MAG HYDROXIDE-SIMETH 200-200-20 MG/5ML PO SUSP: 30.0000 mL | ORAL | Status: DC | PRN

## 2017-12-13 MED ORDER — HYDROMORPHONE HCL 1 MG/ML IJ SOLN
INTRAMUSCULAR | Status: AC
  Filled 2017-12-13: qty 0.5

## 2017-12-13 MED ORDER — LACTATED RINGERS IV SOLN
INTRAVENOUS | Status: DC
  Administered 2017-12-13: 09:00:00 via INTRAVENOUS
  Administered 2017-12-13: 125 mL/h via INTRAVENOUS

## 2017-12-13 SURGICAL SUPPLY — 25 items
CANISTER SUCT 3000ML PPV (MISCELLANEOUS) ×2 IMPLANT
CONT PATH 16OZ SNAP LID 3702 (MISCELLANEOUS) ×2 IMPLANT
DECANTER SPIKE VIAL GLASS SM (MISCELLANEOUS) ×2 IMPLANT
GAUZE PACKING 1 X5 YD ST (GAUZE/BANDAGES/DRESSINGS) ×2 IMPLANT
GLOVE BIOGEL M 6.5 STRL (GLOVE) ×2 IMPLANT
GLOVE BIOGEL PI IND STRL 6.5 (GLOVE) ×1 IMPLANT
GLOVE BIOGEL PI IND STRL 7.0 (GLOVE) ×1 IMPLANT
GLOVE BIOGEL PI INDICATOR 6.5 (GLOVE) ×1
GLOVE BIOGEL PI INDICATOR 7.0 (GLOVE) ×1
GOWN STRL REUS W/TWL LRG LVL3 (GOWN DISPOSABLE) ×8 IMPLANT
LIGASURE IMPACT 36 18CM CVD LR (INSTRUMENTS) ×2 IMPLANT
NS IRRIG 1000ML POUR BTL (IV SOLUTION) ×2 IMPLANT
PACK VAGINAL WOMENS (CUSTOM PROCEDURE TRAY) ×2 IMPLANT
PAD OB MATERNITY 4.3X12.25 (PERSONAL CARE ITEMS) ×2 IMPLANT
SET CYSTO W/LG BORE CLAMP LF (SET/KITS/TRAYS/PACK) IMPLANT
SUT CHROMIC 2 0 CT 1 (SUTURE) IMPLANT
SUT VIC AB 0 CT1 18XCR BRD8 (SUTURE) ×2 IMPLANT
SUT VIC AB 0 CT1 36 (SUTURE) ×4 IMPLANT
SUT VIC AB 0 CT1 8-18 (SUTURE) ×2
SUT VIC AB 2-0 CT1 (SUTURE) IMPLANT
SUT VIC AB 2-0 SH 27 (SUTURE)
SUT VIC AB 2-0 SH 27XBRD (SUTURE) IMPLANT
SUT VICRYL 1 TIES 12X18 (SUTURE) ×2 IMPLANT
TOWEL OR 17X24 6PK STRL BLUE (TOWEL DISPOSABLE) ×4 IMPLANT
TRAY FOLEY W/BAG SLVR 14FR (SET/KITS/TRAYS/PACK) ×2 IMPLANT

## 2017-12-13 NOTE — Op Note (Signed)
12/13/2017  10:49 AM  PATIENT:  Peggy Mahoney  42 y.o. female  PRE-OPERATIVE DIAGNOSIS:  N92.1 Menorrhagia with irregular cycle D25.1 Fibroids, intramural  POST-OPERATIVE DIAGNOSIS:  menorrhagia with irregular cycle, fibroids, intramural  PROCEDURE:  Procedure(s): HYSTERECTOMY VAGINAL (Bilateral)  SURGEON:  Surgeon(s) and Role:    Christophe Louis, MD - Primary    * Janyth Pupa, DO - Assisting  PHYSICIAN ASSISTANT:   ASSISTANTS: Dr. Janyth Pupa assisted due to the complexity of the surgery    ANESTHESIA:   general  EBL:  150 mL   BLOOD ADMINISTERED:none  DRAINS: Urinary Catheter (Foley)   LOCAL MEDICATIONS USED:  LIDOCAINE   SPECIMEN:  Source of Specimen:  uterus cervix and bilateral fallopian tubes   DISPOSITION OF SPECIMEN:  PATHOLOGY  COUNTS:  YES  TOURNIQUET:  * No tourniquets in log *  DICTATION: .Dragon Dictation  PLAN OF CARE: Admit for overnight observation  PATIENT DISPOSITION:  PACU - hemodynamically stable.   Delay start of Pharmacological VTE agent (>24hrs) due to surgical blood loss or risk of bleeding: not applicable  Findings: Enlarged fibroid uterus. Normal appearing cervix. Normal fallopian tubes and ovaries bilaterally.    Procedure: The patient was taken to the operating room placed under general anesthesia. Prepped and draped in the normal sterile fashion. A time out was performed.  A foley catheter was placed. A weighted speculum was placed in to the vagina and the cervix was grasped with a toothed tenaculum. The cervix was then injected circumferentially with 1% xylocaine with 1:100K of epinephrine. The cerix was then circumferentially incised with the bovie  and the bladder was dissected off the pubovesical cervical fascia. The anterior cul-de-sac as entered sharply. The same procedure was performed posteriorly and the posterior cu-lde-sac was entered sharply without difficulty. A heany clamp was placed over the uterosacral ligaments  bilaterally., These were transected and suture ligated with 0 vicryl. The cardinal ligaments were then clamped with ligasure  Bilaterally cauterized and transected bilaterally.    The uterine arteries and the broad ligament were then serially clamped with ligasure cauterized and transected  bilaterally. Excellent hemostasis was visualized. Both cornu were clamped with ligasure cauterized and ,transected .  The uterus was delivered. The right fallopian tube was grasped with a babcock. The mesosalpinx was clamped with the ligasure cauterized and transected. This was repeated on the left fallopian tube. The vaginal cuff angles were closed with an angle suture of 0 vicryl and transfixed to the ipsilateral uterosacral ligaments. The remainder of the vaginal cuff was closed with 0 vicryl in a running locked fashion. All instruments were removed from the vagina. A vaginal packing with estrace cream was placed.    The patient was taken to the recovery room awake and in stable condition.   Sponge lap and needle counts were correct times 2.

## 2017-12-13 NOTE — Anesthesia Postprocedure Evaluation (Signed)
Anesthesia Post Note  Patient: Peggy Mahoney  Procedure(s) Performed: HYSTERECTOMY VAGINAL (Bilateral Vagina )     Patient location during evaluation: PACU Anesthesia Type: General Level of consciousness: sedated Pain management: pain level controlled Vital Signs Assessment: post-procedure vital signs reviewed and stable Respiratory status: spontaneous breathing Cardiovascular status: stable Postop Assessment: no apparent nausea or vomiting Anesthetic complications: no    Last Vitals:  Vitals:   12/13/17 1215 12/13/17 1230  BP: 110/66 120/65  Pulse: 84 84  Resp: 14 11  Temp:    SpO2: 97% 97%    Last Pain:  Vitals:   12/13/17 1230  TempSrc:   PainSc: 1    Pain Goal: Patients Stated Pain Goal: 2 (12/13/17 1200)               Ceniyah Thorp JR,JOHN Mateo Flow

## 2017-12-13 NOTE — Addendum Note (Signed)
Addendum  created 12/13/17 1649 by Hewitt Blade, CRNA   Sign clinical note

## 2017-12-13 NOTE — H&P (Signed)
Date of Initial H&P:12/12/2017  History reviewed, patient examined, no change in status, stable for surgery.

## 2017-12-13 NOTE — Anesthesia Postprocedure Evaluation (Signed)
Anesthesia Post Note  Patient: Peggy Mahoney  Procedure(s) Performed: HYSTERECTOMY VAGINAL (Bilateral Vagina )     Patient location during evaluation: [facility] Anesthesia Type: General Level of consciousness: awake and alert and oriented Pain management: pain level controlled Vital Signs Assessment: post-procedure vital signs reviewed and stable Respiratory status: spontaneous breathing and nonlabored ventilation Cardiovascular status: stable Postop Assessment: able to ambulate and adequate PO intake Anesthetic complications: no    Last Vitals:  Vitals:   12/13/17 1351 12/13/17 1539  BP: 109/60 108/64  Pulse: 84 67  Resp: 18 16  Temp: 37.2 C 36.6 C  SpO2: 97% 96%    Last Pain:  Vitals:   12/13/17 1539  TempSrc: Oral  PainSc:    Pain Goal: Patients Stated Pain Goal: 2 (12/13/17 1245)               Kaveri Perras Hristova

## 2017-12-13 NOTE — Anesthesia Preprocedure Evaluation (Addendum)
Anesthesia Evaluation  Patient identified by MRN, date of birth, ID band Patient awake    Reviewed: Allergy & Precautions, NPO status , Patient's Chart, lab work & pertinent test results  Airway Mallampati: I       Dental no notable dental hx. (+) Teeth Intact   Pulmonary neg pulmonary ROS,    Pulmonary exam normal breath sounds clear to auscultation       Cardiovascular Normal cardiovascular exam Rhythm:Regular Rate:Normal     Neuro/Psych negative neurological ROS     GI/Hepatic GERD  Medicated,  Endo/Other    Renal/GU      Musculoskeletal   Abdominal (+) + obese,   Peds  Hematology   Anesthesia Other Findings   Reproductive/Obstetrics                             Anesthesia Physical Anesthesia Plan  ASA: II  Anesthesia Plan: General   Post-op Pain Management:    Induction: Intravenous  PONV Risk Score and Plan: 4 or greater and Ondansetron, Dexamethasone, Midazolam and Scopolamine patch - Pre-op  Airway Management Planned: Oral ETT  Additional Equipment:   Intra-op Plan:   Post-operative Plan: Extubation in OR  Informed Consent: I have reviewed the patients History and Physical, chart, labs and discussed the procedure including the risks, benefits and alternatives for the proposed anesthesia with the patient or authorized representative who has indicated his/her understanding and acceptance.   Dental advisory given  Plan Discussed with: CRNA and Surgeon  Anesthesia Plan Comments:        Anesthesia Quick Evaluation

## 2017-12-13 NOTE — Anesthesia Procedure Notes (Signed)
Procedure Name: Intubation Performed by: Elenore Paddy, CRNA Pre-anesthesia Checklist: Patient identified, Suction available, Patient being monitored, Timeout performed and Emergency Drugs available Patient Re-evaluated:Patient Re-evaluated prior to induction Oxygen Delivery Method: Simple face mask Preoxygenation: Pre-oxygenation with 100% oxygen Induction Type: IV induction Ventilation: Oral airway inserted - appropriate to patient size Laryngoscope Size: Miller and 3 Grade View: Grade I Tube type: Oral Tube size: 7.0 mm Number of attempts: 1 Airway Equipment and Method: Stylet Placement Confirmation: ETT inserted through vocal cords under direct vision,  positive ETCO2 and breath sounds checked- equal and bilateral Secured at: 21 cm Tube secured with: Tape Dental Injury: Teeth and Oropharynx as per pre-operative assessment

## 2017-12-13 NOTE — Transfer of Care (Signed)
Immediate Anesthesia Transfer of Care Note  Patient: Peggy Mahoney  Procedure(s) Performed: HYSTERECTOMY VAGINAL (Bilateral Vagina )  Patient Location: PACU  Anesthesia Type:General  Level of Consciousness: awake, alert  and oriented  Airway & Oxygen Therapy: Patient Spontanous Breathing and Patient connected to nasal cannula oxygen  Post-op Assessment: Report given to RN and Post -op Vital signs reviewed and stable  Post vital signs: Reviewed and stable HR 90, RR 18, SaO2 99%, BP 140/75  Last Vitals:  Vitals Value Taken Time  BP    Temp    Pulse 95 12/13/2017 10:54 AM  Resp 15 12/13/2017 10:54 AM  SpO2 97 % 12/13/2017 10:54 AM  Vitals shown include unvalidated device data.  Last Pain:  Vitals:   12/13/17 0828  TempSrc: Oral  PainSc: 7       Patients Stated Pain Goal: 2 (90/38/33 3832)  Complications: No apparent anesthesia complications

## 2017-12-14 ENCOUNTER — Encounter (HOSPITAL_COMMUNITY): Payer: Self-pay | Admitting: Obstetrics and Gynecology

## 2017-12-14 DIAGNOSIS — N800 Endometriosis of uterus: Secondary | ICD-10-CM | POA: Diagnosis not present

## 2017-12-14 DIAGNOSIS — N920 Excessive and frequent menstruation with regular cycle: Secondary | ICD-10-CM | POA: Diagnosis present

## 2017-12-14 DIAGNOSIS — D219 Benign neoplasm of connective and other soft tissue, unspecified: Secondary | ICD-10-CM | POA: Diagnosis present

## 2017-12-14 LAB — CBC
HEMATOCRIT: 32.4 % — AB (ref 36.0–46.0)
Hemoglobin: 10.1 g/dL — ABNORMAL LOW (ref 12.0–15.0)
MCH: 24.5 pg — ABNORMAL LOW (ref 26.0–34.0)
MCHC: 31.2 g/dL (ref 30.0–36.0)
MCV: 78.5 fL (ref 78.0–100.0)
Platelets: 253 10*3/uL (ref 150–400)
RBC: 4.13 MIL/uL (ref 3.87–5.11)
RDW: 17.2 % — AB (ref 11.5–15.5)
WBC: 12 10*3/uL — AB (ref 4.0–10.5)

## 2017-12-14 MED ORDER — IBUPROFEN 800 MG PO TABS: 800.0000 mg | ORAL_TABLET | Freq: Three times a day (TID) | ORAL | 1 refills | Status: DC | PRN

## 2017-12-14 MED ORDER — HYDROMORPHONE HCL 2 MG PO TABS: 2.0000 mg | ORAL_TABLET | ORAL | 0 refills | Status: DC | PRN

## 2017-12-14 MED ORDER — HYDROMORPHONE HCL 2 MG PO TABS
2.0000 mg | ORAL_TABLET | ORAL | Status: DC | PRN
  Administered 2017-12-14: 2 mg via ORAL
  Filled 2017-12-14: qty 1

## 2017-12-14 NOTE — Progress Notes (Signed)
Discharge teaching complete with pt. Pt understood all information and did not have any questions. 

## 2017-12-14 NOTE — Progress Notes (Signed)
Vaginal packing removed. Had moderate amount of blood and patient tolerated it well.

## 2017-12-14 NOTE — Discharge Summary (Signed)
Physician Discharge Summary  Patient ID: Peggy Mahoney MRN: [id number] DOB/AGE: 43/30/1977 43 y.o.  Admit date: 12/13/2017 Discharge date: 12/14/2017  Admission Diagnoses: Menorrhagia and uterine fibroids   Discharge Diagnoses:  Active Problems:   S/P vaginal hysterectomy   Menorrhagia   Fibroids   Discharged Condition: stable  Hospital Course: pt was admitted for observation after vaginal hysterectomy.s he did well postoperative with return of bowel and bladder function.   Consults: None  Significant Diagnostic Studies: labs: pod #1 hgb 10.1  Treatments: surgery: vaginal hysterectomy with bilateral salpingectomy   Discharge Exam: Blood pressure (!) 108/54, pulse (!) 59, temperature 98 F (36.7 C), resp. rate 18, height 5\' 4"  (1.626 m), weight 92.1 kg (203 lb), last menstrual period 12/11/2017, SpO2 98 %. General alert and oriented no distress Abdomen soft appropriately tender nondistended  + BS in all 4 quadrants  Ext no edema no evidence of dvt   Disposition: Discharge disposition: 01-Home or Self Care       Discharge Instructions    Call MD for:  persistant nausea and vomiting   Complete by:  As directed    Call MD for:  severe uncontrolled pain   Complete by:  As directed    Call MD for:  temperature >100.4   Complete by:  As directed    Diet - low sodium heart healthy   Complete by:  As directed    Driving Restrictions   Complete by:  As directed    Avoid driving for 1 week   Increase activity slowly   Complete by:  As directed    Lifting restrictions   Complete by:  As directed    Avoid lifting over 10 lbs   No wound care   Complete by:  As directed    Sexual Activity Restrictions   Complete by:  As directed    Avoid sexual activity      Follow-up Information    Christophe Louis, MD. Go in 2 week(s).   Specialty:  Obstetrics and Gynecology Why:  patient already has an appt  Contact information: [address] [place]  [address] [phone]           Signed: Catha Brow. 12/14/2017, 7:49 AM

## 2017-12-15 ENCOUNTER — Other Ambulatory Visit: Payer: Self-pay

## 2017-12-15 ENCOUNTER — Encounter (HOSPITAL_COMMUNITY): Payer: Self-pay | Admitting: *Deleted

## 2017-12-15 ENCOUNTER — Inpatient Hospital Stay (HOSPITAL_COMMUNITY)
Admission: AD | Admit: 2017-12-15 | Discharge: 2017-12-15 | Disposition: A | Discharge status: Home / Self Care | Payer: BC Managed Care – PPO | Source: Ambulatory Visit | Attending: Obstetrics and Gynecology | Admitting: Obstetrics and Gynecology

## 2017-12-15 DIAGNOSIS — R110 Nausea: Secondary | ICD-10-CM | POA: Diagnosis not present

## 2017-12-15 DIAGNOSIS — Z886 Allergy status to analgesic agent status: Secondary | ICD-10-CM | POA: Diagnosis not present

## 2017-12-15 DIAGNOSIS — K5909 Other constipation: Secondary | ICD-10-CM | POA: Insufficient documentation

## 2017-12-15 DIAGNOSIS — K5903 Drug induced constipation: Secondary | ICD-10-CM

## 2017-12-15 DIAGNOSIS — I252 Old myocardial infarction: Secondary | ICD-10-CM | POA: Diagnosis not present

## 2017-12-15 DIAGNOSIS — G8918 Other acute postprocedural pain: Secondary | ICD-10-CM

## 2017-12-15 DIAGNOSIS — K219 Gastro-esophageal reflux disease without esophagitis: Secondary | ICD-10-CM | POA: Diagnosis not present

## 2017-12-15 DIAGNOSIS — Z8673 Personal history of transient ischemic attack (TIA), and cerebral infarction without residual deficits: Secondary | ICD-10-CM | POA: Insufficient documentation

## 2017-12-15 DIAGNOSIS — Z79899 Other long term (current) drug therapy: Secondary | ICD-10-CM | POA: Insufficient documentation

## 2017-12-15 DIAGNOSIS — D259 Leiomyoma of uterus, unspecified: Secondary | ICD-10-CM | POA: Diagnosis not present

## 2017-12-15 DIAGNOSIS — F419 Anxiety disorder, unspecified: Secondary | ICD-10-CM | POA: Insufficient documentation

## 2017-12-15 DIAGNOSIS — E039 Hypothyroidism, unspecified: Secondary | ICD-10-CM | POA: Insufficient documentation

## 2017-12-15 DIAGNOSIS — R109 Unspecified abdominal pain: Secondary | ICD-10-CM | POA: Diagnosis present

## 2017-12-15 LAB — COMPREHENSIVE METABOLIC PANEL
ALK PHOS: 59 U/L (ref 38–126)
ALT: 9 U/L (ref 0–44)
AST: 13 U/L — ABNORMAL LOW (ref 15–41)
Albumin: 3.1 g/dL — ABNORMAL LOW (ref 3.5–5.0)
Anion gap: 8 (ref 5–15)
BILIRUBIN TOTAL: 0.4 mg/dL (ref 0.3–1.2)
BUN: 11 mg/dL (ref 6–20)
CALCIUM: 8.3 mg/dL — AB (ref 8.9–10.3)
CO2: 24 mmol/L (ref 22–32)
CREATININE: 0.59 mg/dL (ref 0.44–1.00)
Chloride: 105 mmol/L (ref 98–111)
Glucose, Bld: 95 mg/dL (ref 70–99)
Potassium: 4 mmol/L (ref 3.5–5.1)
Sodium: 137 mmol/L (ref 135–145)
Total Protein: 6.2 g/dL — ABNORMAL LOW (ref 6.5–8.1)

## 2017-12-15 LAB — CBC WITH DIFFERENTIAL/PLATELET
BASOS ABS: 0 10*3/uL (ref 0.0–0.1)
BASOS PCT: 0 %
EOS PCT: 7 %
Eosinophils Absolute: 0.7 10*3/uL (ref 0.0–0.7)
HCT: 35.6 % — ABNORMAL LOW (ref 36.0–46.0)
Hemoglobin: 10.9 g/dL — ABNORMAL LOW (ref 12.0–15.0)
Lymphocytes Relative: 21 %
Lymphs Abs: 2.3 10*3/uL (ref 0.7–4.0)
MCH: 24.2 pg — AB (ref 26.0–34.0)
MCHC: 30.6 g/dL (ref 30.0–36.0)
MCV: 78.9 fL (ref 78.0–100.0)
MONO ABS: 0.4 10*3/uL (ref 0.1–1.0)
Monocytes Relative: 4 %
Neutro Abs: 7.2 10*3/uL (ref 1.7–7.7)
Neutrophils Relative %: 68 %
Platelets: 286 10*3/uL (ref 150–400)
RBC: 4.51 MIL/uL (ref 3.87–5.11)
RDW: 17.1 % — ABNORMAL HIGH (ref 11.5–15.5)
WBC: 10.6 10*3/uL — AB (ref 4.0–10.5)

## 2017-12-15 LAB — URINALYSIS, ROUTINE W REFLEX MICROSCOPIC
Bilirubin Urine: NEGATIVE
GLUCOSE, UA: NEGATIVE mg/dL
Ketones, ur: NEGATIVE mg/dL
NITRITE: NEGATIVE
PH: 7 (ref 5.0–8.0)
Protein, ur: NEGATIVE mg/dL
SPECIFIC GRAVITY, URINE: 1.011 (ref 1.005–1.030)

## 2017-12-15 MED ORDER — POLYETHYLENE GLYCOL 3350 17 G PO PACK
17.0000 g | PACK | Freq: Once | ORAL | Status: AC
  Administered 2017-12-15: 17 g via ORAL
  Filled 2017-12-15: qty 1

## 2017-12-15 MED ORDER — POLYETHYLENE GLYCOL 3350 17 GM/SCOOP PO POWD: 17.0000 g | Freq: Two times a day (BID) | ORAL | 1 refills | Status: AC

## 2017-12-15 MED ORDER — ONDANSETRON 8 MG PO TBDP
8.0000 mg | ORAL_TABLET | Freq: Once | ORAL | Status: AC
  Administered 2017-12-15: 8 mg via ORAL
  Filled 2017-12-15: qty 1

## 2017-12-15 MED ORDER — ONDANSETRON HCL 4 MG PO TABS: 4.0000 mg | ORAL_TABLET | Freq: Three times a day (TID) | ORAL | 0 refills | Status: AC | PRN

## 2017-12-15 NOTE — MAU Provider Note (Signed)
Chief Complaint: Post-op Problem and Abdominal Pain   SUBJECTIVE HPI: Peggy Zuidema is a 42 y.o. G6P3  who presents to MAU for abdominal pain x 2 days, s/p op (12/13/2017) vaginal hysterectomy with bilateral salpingectomy due to uterine fibroids and mennoraghia. Pt had a vaginal hysterectomy on Wednesday. States she went home yesterday with colace, ibuprofen and dilaudid. States the medications are not helping with pain. States the pain is constant at times but also intermitent and crampy and the pain makes her nauseous. Worse with movement and eases with rest. Rates pain 10/10. Reports that she is also constipated. Pt last has BM today but only three hard small round balls. Pt endorses she is passing gas, urinating, tolerates po feeds, and denies fever, cp, sob, no vaginal bleeding or discharge, no bruising. Pt endorses "feeling very raw" in her vaginal area. Denies h/o hsv.  Post op Hgb was 10.1 with an EBL of 150.    Past Medical History:  Diagnosis Date  . Anemia   . Anxiety   . Bronchitis    has inhaler prn  . Colitis   . GERD (gastroesophageal reflux disease)   . Hypothyroidism   . MI (myocardial infarction) ([facility]) 11/08/2006  . MVP (mitral valve prolapse)   . Stroke ([facility]) 01/04/2007  . SVD (spontaneous vaginal delivery)    x 3   OB History  No data available   Past Surgical History:  Procedure Laterality Date  . DILATION AND CURETTAGE OF UTERUS     x 3 terminations  . VAGINAL HYSTERECTOMY Bilateral 12/13/2017   Procedure: HYSTERECTOMY VAGINAL;  Surgeon: Christophe Louis, MD;  Location: [facility];  Service: Gynecology;  Laterality: Bilateral;  . WISDOM TOOTH EXTRACTION     Social History   Socioeconomic History  . Marital status: Married    Spouse name: Not on file  . Number of children: Not on file  . Years of education: Not on file  . Highest education level: Not on file  Occupational History  . Not on file  Social Needs  . Financial resource strain: Not on file  .  Food insecurity:    Worry: Not on file    Inability: Not on file  . Transportation needs:    Medical: Not on file    Non-medical: Not on file  Tobacco Use  . Smoking status: Never Smoker  . Smokeless tobacco: Never Used  Substance and Sexual Activity  . Alcohol use: No  . Drug use: No  . Sexual activity: Yes    Birth control/protection: None, Surgical  Lifestyle  . Physical activity:    Days per week: Not on file    Minutes per session: Not on file  . Stress: Not on file  Relationships  . Social connections:    Talks on phone: Not on file    Gets together: Not on file    Attends religious service: Not on file    Active member of club or organization: Not on file    Attends meetings of clubs or organizations: Not on file    Relationship status: Not on file  . Intimate partner violence:    Fear of current or ex partner: Not on file    Emotionally abused: Not on file    Physically abused: Not on file    Forced sexual activity: Not on file  Other Topics Concern  . Not on file  Social History Narrative  . Not on file   No current facility-administered medications on file  prior to encounter.    Current Outpatient Medications on File Prior to Encounter  Medication Sig Dispense Refill  . albuterol (PROVENTIL HFA;VENTOLIN HFA) 108 (90 Base) MCG/ACT inhaler Inhale 1-2 puffs into the lungs every 6 (six) hours as needed for wheezing or shortness of breath.    . CRANBERRY-VITAMIN C PO Take 1 tablet by mouth daily.    Marland Kitchen docusate sodium (COLACE) 100 MG capsule Take 100 mg by mouth 2 (two) times daily.    Marland Kitchen HYDROmorphone (DILAUDID) 2 MG tablet Take 1 tablet (2 mg total) by mouth every 4 (four) hours as needed for severe pain. 30 tablet 0  . ibuprofen (ADVIL,MOTRIN) 800 MG tablet Take 1 tablet (800 mg total) by mouth every 8 (eight) hours as needed for mild pain or moderate pain. 30 tablet 1  . ondansetron (ZOFRAN) 8 MG tablet Take 8 mg by mouth every 8 (eight) hours as needed for nausea  or vomiting.     . promethazine (PHENERGAN) 25 MG tablet Take 25 mg by mouth every 8 (eight) hours as needed for nausea or vomiting.    . ranitidine (ZANTAC) 150 MG capsule Take 150 mg by mouth daily as needed for heartburn.     . sertraline (ZOLOFT) 100 MG tablet Take 100 mg by mouth at bedtime. Takes 100 mg at bedtime and 50 mg in the morning    . sertraline (ZOLOFT) 50 MG tablet Take 50 mg by mouth daily. Takes 50 mg int he morning and 100 mg at bedtime    . methimazole (TAPAZOLE) 5 MG tablet Take 1 tablet (5 mg total) by mouth daily. 30 tablet 2   Allergies  Allergen Reactions  . Naproxen Nausea And Vomiting  . Pineapple Flavor     Blisters     I have reviewed the past Medical Hx, Surgical Hx, Social Hx, Allergies and Medications.   REVIEW OF SYSTEMS All systems reviewed and are negative for acute change except as noted in the HPI.   OBJECTIVE BP 127/66 (BP Location: Right Arm)   Pulse 75   Temp 98.9 F (37.2 C) (Oral)   Resp 18   LMP 12/11/2017 (Exact Date)    PHYSICAL EXAM Constitutional: Well-developed, well-nourished female in no acute distress.  Cardiovascular: normal rate and rhythm, pulses intact Respiratory: normal rate and effort.  GI: Abd soft, not rigid, non-distended, no rebound tenderness. Tender to palpate all over. Pos BS x 4 MS: Extremities nontender, mild non-pitting edema, normal ROM Neurologic: Alert and oriented x 4. No focal deficits GU: Neg CVAT. Vaginal: small circular open ulcers on left labia majora, no erythema, no ecchymosis, no hematoma, no blood sin in around vagina. Small either ulceration or abrasion to inside of left labia minora.  Psych: normal mood and affect  LAB RESULTS Results for orders placed or performed during the hospital encounter of 12/15/17 (from the past 24 hour(s))  Urinalysis, Routine w reflex microscopic     Status: Abnormal   Collection Time: 12/15/17  7:37 PM  Result Value Ref Range   Color, Urine YELLOW YELLOW    APPearance CLEAR CLEAR   Specific Gravity, Urine 1.011 1.005 - 1.030   pH 7.0 5.0 - 8.0   Glucose, UA NEGATIVE NEGATIVE mg/dL   Hgb urine dipstick SMALL (A) NEGATIVE   Bilirubin Urine NEGATIVE NEGATIVE   Ketones, ur NEGATIVE NEGATIVE mg/dL   Protein, ur NEGATIVE NEGATIVE mg/dL   Nitrite NEGATIVE NEGATIVE   Leukocytes, UA TRACE (A) NEGATIVE   RBC / HPF 0-5  0 - 5 RBC/hpf   WBC, UA 0-5 0 - 5 WBC/hpf   Bacteria, UA RARE (A) NONE SEEN   Squamous Epithelial / LPF 0-5 0 - 5   Mucus PRESENT   CBC with Differential/Platelet     Status: Abnormal   Collection Time: 12/15/17  7:54 PM  Result Value Ref Range   WBC 10.6 (H) 4.0 - 10.5 K/uL   RBC 4.51 3.87 - 5.11 MIL/uL   Hemoglobin 10.9 (L) 12.0 - 15.0 g/dL   HCT 35.6 (L) 36.0 - 46.0 %   MCV 78.9 78.0 - 100.0 fL   MCH 24.2 (L) 26.0 - 34.0 pg   MCHC 30.6 30.0 - 36.0 g/dL   RDW 17.1 (H) 11.5 - 15.5 %   Platelets 286 150 - 400 K/uL   Neutrophils Relative % 68 %   Neutro Abs 7.2 1.7 - 7.7 K/uL   Lymphocytes Relative 21 %   Lymphs Abs 2.3 0.7 - 4.0 K/uL   Monocytes Relative 4 %   Monocytes Absolute 0.4 0.1 - 1.0 K/uL   Eosinophils Relative 7 %   Eosinophils Absolute 0.7 0.0 - 0.7 K/uL   Basophils Relative 0 %   Basophils Absolute 0.0 0.0 - 0.1 K/uL  Comprehensive metabolic panel     Status: Abnormal   Collection Time: 12/15/17  7:54 PM  Result Value Ref Range   Sodium 137 135 - 145 mmol/L   Potassium 4.0 3.5 - 5.1 mmol/L   Chloride 105 98 - 111 mmol/L   CO2 24 22 - 32 mmol/L   Glucose, Bld 95 70 - 99 mg/dL   BUN 11 6 - 20 mg/dL   Creatinine, Ser 0.59 0.44 - 1.00 mg/dL   Calcium 8.3 (L) 8.9 - 10.3 mg/dL   Total Protein 6.2 (L) 6.5 - 8.1 g/dL   Albumin 3.1 (L) 3.5 - 5.0 g/dL   AST 13 (L) 15 - 41 U/L   ALT 9 0 - 44 U/L   Alkaline Phosphatase 59 38 - 126 U/L   Total Bilirubin 0.4 0.3 - 1.2 mg/dL   GFR calc non Af Amer >60 >60 mL/min   GFR calc Af Amer >60 >60 mL/min   Anion gap 8 5 - 15    IMAGING No results found.  MAU  Management/MDM: Vitals and nursing notes reviewed Orders Placed This Encounter  Procedures  . CBC with Differential/Platelet  . Comprehensive metabolic panel  . Urinalysis, Routine w reflex microscopic  . Soap suds enema  . Commode at bedside  . Discharge patient Discharge disposition: 01-Home or Self Care; Discharge patient date: 12/15/2017    Meds ordered this encounter  Medications  . polyethylene glycol (MIRALAX / GLYCOLAX) packet 17 g  . ondansetron (ZOFRAN-ODT) disintegrating tablet 8 mg  . ondansetron (ZOFRAN) 4 MG tablet    Sig: Take 1 tablet (4 mg total) by mouth every 8 (eight) hours as needed for up to 14 days for nausea or vomiting.    Dispense:  30 tablet    Refill:  0    Order Specific Question:   Supervising Provider    Answer:   ROBERTS, ANGELA [[id number]]  . polyethylene glycol powder (GLYCOLAX) powder    Sig: Take 17 g by mouth 2 (two) times daily.    Dispense:  255 g    Refill:  1    Order Specific Question:   Supervising Provider    Answer:   Everett Graff [[id number]]    Plan of care reviewed with patient,  including labs and tests ordered and medical treatment.  Consult Dr Mancel Bale.  Treatments in MAU included cbc, cmp, mirilax, enema, zofran, and discharge home if stable.   ASSESSMENT  Kaylei Maryfrances Mahoney is a 42 y.o. G6P3 dx with : 1. Postoperative pain   2. Drug-induced constipation   3. Nausea   Was relieved by enema and mirilax, Nausea resolved with zofran, pt stable and discharge home.  Possible HSV based on PE: Pt declined antiviral or testing, pt stated she would talk with Dr. Landry Mellow next time she sees her.   PLAN Discharge home in stable condition. Pt discharged with strict infection precautions. Counseled on return precautions Handout given Post-op pain: Heating pad on abdomen, Vaginal ice packs. , continue po dilaudid with motrin. Take zofran 30 mins before dilaudid to decrease nausea.  Constipation: Soap sud enema, Colace and Mirilax BID.    Follow-up Information    [facility] Follow up.   Specialty:  Obstetrics and Gynecology Why:  F/U with Dr Landry Mellow in one week.  Contact information: [address] [place] [address] [phone]           Allergies as of 12/15/2017      Reactions   Naproxen Nausea And Vomiting   Pineapple Flavor    Blisters       Medication List    TAKE these medications   albuterol 108 (90 Base) MCG/ACT inhaler Commonly known as:  PROVENTIL HFA;VENTOLIN HFA Inhale 1-2 puffs into the lungs every 6 (six) hours as needed for wheezing or shortness of breath.   CRANBERRY-VITAMIN C PO Take 1 tablet by mouth daily.   docusate sodium 100 MG capsule Commonly known as:  COLACE Take 100 mg by mouth 2 (two) times daily.   HYDROmorphone 2 MG tablet Commonly known as:  DILAUDID Take 1 tablet (2 mg total) by mouth every 4 (four) hours as needed for severe pain.   ibuprofen 800 MG tablet Commonly known as:  ADVIL,MOTRIN Take 1 tablet (800 mg total) by mouth every 8 (eight) hours as needed for mild pain or moderate pain.   methimazole 5 MG tablet Commonly known as:  TAPAZOLE Take 1 tablet (5 mg total) by mouth daily.   ondansetron 4 MG tablet Commonly known as:  ZOFRAN Take 1 tablet (4 mg total) by mouth every 8 (eight) hours as needed for up to 14 days for nausea or vomiting. What changed:    medication strength  how much to take   polyethylene glycol powder powder Commonly known as:  GLYCOLAX Take 17 g by mouth 2 (two) times daily.   promethazine 25 MG tablet Commonly known as:  PHENERGAN Take 25 mg by mouth every 8 (eight) hours as needed for nausea or vomiting.   ranitidine 150 MG capsule Commonly known as:  ZANTAC Take 150 mg by mouth daily as needed for heartburn.   sertraline 50 MG tablet Commonly known as:  ZOLOFT Take 50 mg by mouth daily. Takes 50 mg int he morning and 100 mg at bedtime   sertraline 100 MG tablet Commonly known  as:  ZOLOFT Take 100 mg by mouth at bedtime. Takes 100 mg at bedtime and 50 mg in the morning     Anacleto Batterman NP-C, CNM  12/15/2017, 9:32 PM

## 2017-12-15 NOTE — MAU Note (Signed)
Pt had a vaginal hysterectomy on Wednesday. States she went home yesterday with colace, ibuprofen and dilaudid. States the medications are not helping with pain. States the pain is constant and crampy and the pain makes her nauseous. Rates pain 10/10. Reports that she is also constipated.

## 2017-12-15 NOTE — MAU Note (Signed)
Had vag hyst on Wed, was released yesterday.  Sent home with Ibuprofen, Dilaudid and Colace. Nothing is working.

## 2018-02-27 ENCOUNTER — Other Ambulatory Visit: Payer: Self-pay

## 2018-02-27 ENCOUNTER — Emergency Department (HOSPITAL_BASED_OUTPATIENT_CLINIC_OR_DEPARTMENT_OTHER): Payer: BC Managed Care – PPO

## 2018-02-27 ENCOUNTER — Emergency Department (HOSPITAL_BASED_OUTPATIENT_CLINIC_OR_DEPARTMENT_OTHER)
Admission: EM | Admit: 2018-02-27 | Discharge: 2018-02-27 | Disposition: A | Discharge status: Home / Self Care | Payer: BC Managed Care – PPO | Attending: Emergency Medicine | Admitting: Emergency Medicine

## 2018-02-27 ENCOUNTER — Encounter (HOSPITAL_BASED_OUTPATIENT_CLINIC_OR_DEPARTMENT_OTHER): Payer: Self-pay | Admitting: Emergency Medicine

## 2018-02-27 DIAGNOSIS — B9689 Other specified bacterial agents as the cause of diseases classified elsewhere: Secondary | ICD-10-CM | POA: Diagnosis not present

## 2018-02-27 DIAGNOSIS — I252 Old myocardial infarction: Secondary | ICD-10-CM | POA: Insufficient documentation

## 2018-02-27 DIAGNOSIS — N83202 Unspecified ovarian cyst, left side: Secondary | ICD-10-CM | POA: Insufficient documentation

## 2018-02-27 DIAGNOSIS — E119 Type 2 diabetes mellitus without complications: Secondary | ICD-10-CM | POA: Insufficient documentation

## 2018-02-27 DIAGNOSIS — N7011 Chronic salpingitis: Secondary | ICD-10-CM | POA: Diagnosis not present

## 2018-02-27 DIAGNOSIS — I2510 Atherosclerotic heart disease of native coronary artery without angina pectoris: Secondary | ICD-10-CM | POA: Insufficient documentation

## 2018-02-27 DIAGNOSIS — Z79899 Other long term (current) drug therapy: Secondary | ICD-10-CM | POA: Insufficient documentation

## 2018-02-27 DIAGNOSIS — E039 Hypothyroidism, unspecified: Secondary | ICD-10-CM | POA: Insufficient documentation

## 2018-02-27 DIAGNOSIS — N760 Acute vaginitis: Secondary | ICD-10-CM | POA: Insufficient documentation

## 2018-02-27 DIAGNOSIS — R102 Pelvic and perineal pain: Secondary | ICD-10-CM | POA: Diagnosis not present

## 2018-02-27 LAB — URINALYSIS, MICROSCOPIC (REFLEX)

## 2018-02-27 LAB — URINALYSIS, ROUTINE W REFLEX MICROSCOPIC
Bilirubin Urine: NEGATIVE
Glucose, UA: NEGATIVE mg/dL
KETONES UR: 15 mg/dL — AB
Leukocytes, UA: NEGATIVE
Nitrite: NEGATIVE
PROTEIN: NEGATIVE mg/dL
Specific Gravity, Urine: >1.03 — ABNORMAL HIGH (ref 1.005–1.030)
pH: 5 (ref 5.0–8.0)

## 2018-02-27 LAB — COMPREHENSIVE METABOLIC PANEL
ALK PHOS: 74 U/L (ref 38–126)
ALT: 13 U/L (ref 0–44)
ANION GAP: 9 (ref 5–15)
AST: 14 U/L — ABNORMAL LOW (ref 15–41)
Albumin: 3.7 g/dL (ref 3.5–5.0)
BILIRUBIN TOTAL: 0.5 mg/dL (ref 0.3–1.2)
BUN: 21 mg/dL — ABNORMAL HIGH (ref 6–20)
CALCIUM: 9.1 mg/dL (ref 8.9–10.3)
CO2: 25 mmol/L (ref 22–32)
CREATININE: 0.81 mg/dL (ref 0.44–1.00)
Chloride: 104 mmol/L (ref 98–111)
GFR calc Af Amer: >60 mL/min (ref >60)
Glucose, Bld: 102 mg/dL — ABNORMAL HIGH (ref 70–99)
Potassium: 3.9 mmol/L (ref 3.5–5.1)
Sodium: 138 mmol/L (ref 135–145)
TOTAL PROTEIN: 7.1 g/dL (ref 6.5–8.1)

## 2018-02-27 LAB — WET PREP, GENITAL
Sperm: NONE SEEN
TRICH WET PREP: NONE SEEN
Yeast Wet Prep HPF POC: NONE SEEN

## 2018-02-27 LAB — CBC WITH DIFFERENTIAL/PLATELET
Basophils Absolute: 0 10*3/uL (ref 0.0–0.1)
Basophils Relative: 0 %
EOS ABS: 0.4 10*3/uL (ref 0.0–0.7)
Eosinophils Relative: 5 %
HCT: 34.9 % — ABNORMAL LOW (ref 36.0–46.0)
HEMOGLOBIN: 11.2 g/dL — AB (ref 12.0–15.0)
LYMPHS PCT: 26 %
Lymphs Abs: 2.6 10*3/uL (ref 0.7–4.0)
MCH: 24.2 pg — ABNORMAL LOW (ref 26.0–34.0)
MCHC: 32.1 g/dL (ref 30.0–36.0)
MCV: 75.5 fL — AB (ref 78.0–100.0)
MONOS PCT: 7 %
Monocytes Absolute: 0.7 10*3/uL (ref 0.1–1.0)
NEUTROS PCT: 62 %
Neutro Abs: 6.2 10*3/uL (ref 1.7–7.7)
Platelets: 336 10*3/uL (ref 150–400)
RBC: 4.62 MIL/uL (ref 3.87–5.11)
RDW: 15.3 % (ref 11.5–15.5)
WBC: 9.8 10*3/uL (ref 4.0–10.5)

## 2018-02-27 LAB — PREGNANCY, URINE: PREG TEST UR: NEGATIVE

## 2018-02-27 MED ORDER — TRAMADOL HCL 50 MG PO TABS: 50.0000 mg | ORAL_TABLET | Freq: Four times a day (QID) | ORAL | 0 refills | Status: DC | PRN

## 2018-02-27 MED ORDER — METRONIDAZOLE 500 MG PO TABS: 500.0000 mg | ORAL_TABLET | Freq: Two times a day (BID) | ORAL | 0 refills | Status: DC

## 2018-02-27 MED ORDER — DOXYCYCLINE HYCLATE 100 MG PO CAPS: 100.0000 mg | ORAL_CAPSULE | Freq: Two times a day (BID) | ORAL | 0 refills | Status: DC

## 2018-02-27 MED ORDER — MORPHINE SULFATE (PF) 4 MG/ML IV SOLN
4.0000 mg | Freq: Once | INTRAVENOUS | Status: AC
  Administered 2018-02-27: 4 mg via INTRAVENOUS
  Filled 2018-02-27: qty 1

## 2018-02-27 MED ORDER — ONDANSETRON HCL 4 MG/2ML IJ SOLN
4.0000 mg | Freq: Once | INTRAMUSCULAR | Status: AC
  Administered 2018-02-27: 4 mg via INTRAVENOUS
  Filled 2018-02-27: qty 2

## 2018-02-27 MED ORDER — SODIUM CHLORIDE 0.9 % IV BOLUS
1000.0000 mL | Freq: Once | INTRAVENOUS | Status: AC
  Administered 2018-02-27: 1000 mL via INTRAVENOUS

## 2018-02-27 MED ORDER — IBUPROFEN 800 MG PO TABS: 800.0000 mg | ORAL_TABLET | Freq: Three times a day (TID) | ORAL | 0 refills | Status: DC | PRN

## 2018-02-27 NOTE — Discharge Instructions (Signed)
You were  seen in the emergency department today for pelvic pain.  Your labs show that you continue to have low hemoglobin of 11.2, this is consistent with prior lab work you have had done.  Your urine had some bacteria in it, we are going to culture this.  Wet prep had findings consistent with bacterial vaginosis.  Your ultrasound showed that you have a 2.7 cm left ovarian cyst as well as a small right-sided hydrosalpinx (fluid at the fallopian tube).   We discussed with your OB/GYN provider's practice, we are starting you on antibiotics, doxycycline and Flagyl.  You may not consume alcohol when taking Flagyl as it can have severe side effects.  Please take these antibiotics as prescribed.  Also sending you home with ibuprofen and tramadol.  Take ibuprofen 800 mg every 8 hours as needed for pain, take this with food as it can cause stomach upset and it were stomach bleeding.  Not take other NSAIDs when taking ibuprofen.  Tramadol is a controlled pain medication, take this every 6 hours as needed for severe pain.  Do not drive or operate heavy machinery when taking tramadol.  Keep tramadol out of the reach of small children.  Do not drink alcohol or take other sedating medicines with this medicine.  Take Tylenol safely per over-the-counter dosing with these medications.  We have prescribed you new medication(s) today. Discuss the medications prescribed today with your pharmacist as they can have adverse effects and interactions with your other medicines including over the counter and prescribed medications. Seek medical evaluation if you start to experience new or abnormal symptoms after taking one of these medicines, seek care immediately if you start to experience difficulty breathing, feeling of your throat closing, facial swelling, or rash as these could be indications of a more serious allergic reaction  We would like you to follow-up with Dr. Sundra Aland office within 1 week.  Return to the ER for new or  worsening symptoms or any other concerns.

## 2018-02-27 NOTE — ED Triage Notes (Signed)
Pt reports sharp suprapubic pain x 1 hour.

## 2018-02-27 NOTE — ED Provider Notes (Signed)
[facility] EMERGENCY DEPARTMENT Provider Note   CSN: [id number] Arrival date & time: 02/27/18  1424     History   Chief Complaint Chief Complaint  Patient presents with  . Abdominal Pain    HPI Peggy Mahoney is a 42 y.o. female with a hx of CAD, GERD, IBS, DM, hypothyroidism, and stroke who presents to the ED with complaints of pelvic pain which started fairly suddenly at 13:30 this afternoon. Patient states that she stood up from her desk at work and felt fairly quick onset pain to the central suprapubic area which radiated to diffuse pelvic/vaginal area. Pain has been waxing/waning since onset with intermittent sharp increases in pain. Not necessarily worse on one side or the other. She does have some mild associated nausea. She has some spotting yesterday, but otherwise no vaginal bleeding, no spotting today. Patient is sexually active with her husband in a monogamous relationship, no concern for STDs.  Denies fever, chills, vomiting, diarrhea, dysuria, frequency, urgency, blood in stool, or vaginal discharge. She did have a vaginal hysterectomy performed by Dr. Landry Mellow 12/13/17, did generally well post op, ovaries remain in place.   HPI  Past Medical History:  Diagnosis Date  . Anemia   . Anxiety   . Bronchitis    has inhaler prn  . Colitis   . GERD (gastroesophageal reflux disease)   . Hypothyroidism   . MI (myocardial infarction) ([place]) 11/08/2006  . MVP (mitral valve prolapse)   . Stroke ([place]) 01/04/2007  . SVD (spontaneous vaginal delivery)    x 3    Patient Active Problem List   Diagnosis Date Noted  . Menorrhagia 12/14/2017  . Fibroids 12/14/2017  . S/P vaginal hysterectomy 12/13/2017  . IBS (irritable bowel syndrome) 03/17/2017  . Hyperthyroidism 10/19/2016  . Diabetes [facility])     Past Surgical History:  Procedure Laterality Date  . ABDOMINAL HYSTERECTOMY    . DILATION AND CURETTAGE OF UTERUS     x 3 terminations  . VAGINAL HYSTERECTOMY  Bilateral 12/13/2017   Procedure: HYSTERECTOMY VAGINAL;  Surgeon: Christophe Louis, MD;  Location: [facility];  Service: Gynecology;  Laterality: Bilateral;  . WISDOM TOOTH EXTRACTION       OB History   None      Home Medications    Prior to Admission medications   Medication Sig Start Date End Date Taking? Authorizing Provider  albuterol (PROVENTIL HFA;VENTOLIN HFA) 108 (90 Base) MCG/ACT inhaler Inhale 1-2 puffs into the lungs every 6 (six) hours as needed for wheezing or shortness of breath.    [provider]  CRANBERRY-VITAMIN C PO Take 1 tablet by mouth daily.    [provider]  docusate sodium (COLACE) 100 MG capsule Take 100 mg by mouth 2 (two) times daily.    [provider]  HYDROmorphone (DILAUDID) 2 MG tablet Take 1 tablet (2 mg total) by mouth every 4 (four) hours as needed for severe pain. 12/14/17   Christophe Louis, MD  ibuprofen (ADVIL,MOTRIN) 800 MG tablet Take 1 tablet (800 mg total) by mouth every 8 (eight) hours as needed for mild pain or moderate pain. 12/14/17   Christophe Louis, MD  methimazole (TAPAZOLE) 5 MG tablet Take 1 tablet (5 mg total) by mouth daily. 10/18/16   Renato Shin, MD  polyethylene glycol powder (GLYCOLAX) powder Take 17 g by mouth 2 (two) times daily. 12/15/17 12/15/18  Noralyn Pick, FNP  promethazine (PHENERGAN) 25 MG tablet Take 25 mg by mouth every 8 (eight) hours as  needed for nausea or vomiting.    [provider]  ranitidine (ZANTAC) 150 MG capsule Take 150 mg by mouth daily as needed for heartburn.  07/14/16   [provider]  sertraline (ZOLOFT) 100 MG tablet Take 100 mg by mouth at bedtime. Takes 100 mg at bedtime and 50 mg in the morning    [provider]  sertraline (ZOLOFT) 50 MG tablet Take 50 mg by mouth daily. Takes 50 mg int he morning and 100 mg at bedtime    [provider]    Family History Family History  Problem Relation Age of Onset  . Thyroid disease Neg Hx     Social  History Social History   Tobacco Use  . Smoking status: Never Smoker  . Smokeless tobacco: Never Used  Substance Use Topics  . Alcohol use: No  . Drug use: No     Allergies   Naproxen and Pineapple flavor   Review of Systems Review of Systems  Constitutional: Negative for chills and fever.  Respiratory: Negative for shortness of breath.   Cardiovascular: Negative for chest pain.  Gastrointestinal: Positive for abdominal pain and nausea. Negative for anal bleeding, blood in stool, constipation, diarrhea and vomiting.  Genitourinary: Positive for pelvic pain and vaginal bleeding (spotting yesterday, none today). Negative for dysuria, frequency and vaginal discharge.  All other systems reviewed and are negative.  Physical Exam Updated Vital Signs BP 126/82 (BP Location: Right Arm)   Pulse 91   Temp 98.6 F (37 C) (Oral)   Resp 20   Ht 5\' 4"  (1.626 m)   Wt 89.8 kg   LMP 12/11/2017 (Exact Date)   SpO2 100%   BMI 33.99 kg/m   Physical Exam  Constitutional: She appears well-developed and well-nourished.  Non-toxic appearance. No distress.  HENT:  Head: Normocephalic and atraumatic.  Eyes: Conjunctivae are normal. Right eye exhibits no discharge. Left eye exhibits no discharge.  Neck: Neck supple.  Cardiovascular: Normal rate and regular rhythm.  Pulmonary/Chest: Effort normal and breath sounds normal. No respiratory distress. She has no wheezes. She has no rhonchi. She has no rales.  Respiration even and unlabored  Abdominal: Soft. She exhibits no distension. There is tenderness in the suprapubic area. There is no rigidity, no rebound, no guarding, no tenderness at McBurney's point and negative Murphy's sign.  Genitourinary: Pelvic exam was performed with patient supine. There is no rash or tenderness on the right labia. There is no rash or tenderness on the left labia. Right adnexum displays tenderness. Right adnexum displays no mass. Left adnexum displays tenderness. Left  adnexum displays no mass. No bleeding in the vagina. Vaginal discharge (white somewhat thick, mild amount) found.  Genitourinary Comments: Patient diffusely tender throughout pelvic exam. EDT present as chaperone.   Neurological: She is alert.  Clear speech.   Skin: Skin is warm and dry. No rash noted.  Psychiatric: She has a normal mood and affect. Her behavior is normal.  Nursing note and vitals reviewed.    ED Treatments / Results  Labs (all labs ordered are listed, but only abnormal results are displayed) Labs Reviewed  WET PREP, GENITAL - Abnormal; Notable for the following components:      Result Value   Clue Cells Wet Prep HPF POC PRESENT (*)    WBC, Wet Prep HPF POC MANY (*)    All other components within normal limits  URINALYSIS, ROUTINE W REFLEX MICROSCOPIC - Abnormal; Notable for the following components:   Specific Gravity,  Urine >1.030 (*)    Hgb urine dipstick SMALL (*)    Ketones, ur 15 (*)    All other components within normal limits  URINALYSIS, MICROSCOPIC (REFLEX) - Abnormal; Notable for the following components:   Bacteria, UA MANY (*)    All other components within normal limits  CBC WITH DIFFERENTIAL/PLATELET - Abnormal; Notable for the following components:   Hemoglobin 11.2 (*)    HCT 34.9 (*)    MCV 75.5 (*)    MCH 24.2 (*)    All other components within normal limits  COMPREHENSIVE METABOLIC PANEL - Abnormal; Notable for the following components:   Glucose, Bld 102 (*)    BUN 21 (*)    AST 14 (*)    All other components within normal limits  URINE CULTURE  PREGNANCY, URINE  HIV ANTIBODY (ROUTINE TESTING)  RPR  GC/CHLAMYDIA PROBE AMP ([place]) NOT AT [facility]    EKG None  Radiology US Transvaginal Non-ob  Result Date: 02/27/2018 CLINICAL DATA:  Initial evaluation for acute pelvic pain for 6 hours. Prior hysterectomy. EXAM: TRANSABDOMINAL AND TRANSVAGINAL ULTRASOUND OF PELVIS DOPPLER ULTRASOUND OF OVARIES TECHNIQUE: Both transabdominal and  transvaginal ultrasound examinations of the pelvis were performed. Transabdominal technique was performed for global imaging of the pelvis including uterus, ovaries, adnexal regions, and pelvic cul-de-sac. It was necessary to proceed with endovaginal exam following the transabdominal exam to visualize the pelvic structures. Color and duplex Doppler ultrasound was utilized to evaluate blood flow to the ovaries. COMPARISON:  Prior CT from 06/08/2016 FINDINGS: Uterus Prior hysterectomy.  No abnormality at the vaginal cuff. Endometrium Surgically absent. Right ovary Measurements: 2.8 x 2.2 x 1.3 cm. Right ovary normal in appearance. Anechoic tubular cystic structure within the right adnexa of most consistent with a small hydrosalpinx. Left ovary Measurements: 4.1 x 2.7 x 2.2 cm. 2.7 x 2.1 x 1.7 cm simple cyst, most consistent with a normal physiologic follicular cyst. Pulsed Doppler evaluation of both ovaries demonstrates normal low-resistance arterial and venous waveforms. Other findings Small volume free physiologic fluid within the pelvis. IMPRESSION: 1. 2.7 cm simple left ovarian cyst, most consistent with a normal physiologic follicular cyst. 2. Small right-sided hydrosalpinx. 3. No other acute abnormality within the pelvis. No evidence for torsion. 4. Prior hysterectomy. Electronically Signed   By: Jeannine Boga M.D.   On: 02/27/2018 19:07   US Pelvis Complete  Result Date: 02/27/2018 CLINICAL DATA:  Initial evaluation for acute pelvic pain for 6 hours. Prior hysterectomy. EXAM: TRANSABDOMINAL AND TRANSVAGINAL ULTRASOUND OF PELVIS DOPPLER ULTRASOUND OF OVARIES TECHNIQUE: Both transabdominal and transvaginal ultrasound examinations of the pelvis were performed. Transabdominal technique was performed for global imaging of the pelvis including uterus, ovaries, adnexal regions, and pelvic cul-de-sac. It was necessary to proceed with endovaginal exam following the transabdominal exam to visualize the pelvic  structures. Color and duplex Doppler ultrasound was utilized to evaluate blood flow to the ovaries. COMPARISON:  Prior CT from 06/08/2016 FINDINGS: Uterus Prior hysterectomy.  No abnormality at the vaginal cuff. Endometrium Surgically absent. Right ovary Measurements: 2.8 x 2.2 x 1.3 cm. Right ovary normal in appearance. Anechoic tubular cystic structure within the right adnexa of most consistent with a small hydrosalpinx. Left ovary Measurements: 4.1 x 2.7 x 2.2 cm. 2.7 x 2.1 x 1.7 cm simple cyst, most consistent with a normal physiologic follicular cyst. Pulsed Doppler evaluation of both ovaries demonstrates normal low-resistance arterial and venous waveforms. Other findings Small volume free physiologic fluid within the pelvis. IMPRESSION: 1. 2.7 cm  simple left ovarian cyst, most consistent with a normal physiologic follicular cyst. 2. Small right-sided hydrosalpinx. 3. No other acute abnormality within the pelvis. No evidence for torsion. 4. Prior hysterectomy. Electronically Signed   By: Jeannine Boga M.D.   On: 02/27/2018 19:07   [place] Art/ven Flow Abd Pelv Doppler  Result Date: 02/27/2018 CLINICAL DATA:  Initial evaluation for acute pelvic pain for 6 hours. Prior hysterectomy. EXAM: TRANSABDOMINAL AND TRANSVAGINAL ULTRASOUND OF PELVIS DOPPLER ULTRASOUND OF OVARIES TECHNIQUE: Both transabdominal and transvaginal ultrasound examinations of the pelvis were performed. Transabdominal technique was performed for global imaging of the pelvis including uterus, ovaries, adnexal regions, and pelvic cul-de-sac. It was necessary to proceed with endovaginal exam following the transabdominal exam to visualize the pelvic structures. Color and duplex Doppler ultrasound was utilized to evaluate blood flow to the ovaries. COMPARISON:  Prior CT from 06/08/2016 FINDINGS: Uterus Prior hysterectomy.  No abnormality at the vaginal cuff. Endometrium Surgically absent. Right ovary Measurements: 2.8 x 2.2 x 1.3 cm. Right ovary  normal in appearance. Anechoic tubular cystic structure within the right adnexa of most consistent with a small hydrosalpinx. Left ovary Measurements: 4.1 x 2.7 x 2.2 cm. 2.7 x 2.1 x 1.7 cm simple cyst, most consistent with a normal physiologic follicular cyst. Pulsed Doppler evaluation of both ovaries demonstrates normal low-resistance arterial and venous waveforms. Other findings Small volume free physiologic fluid within the pelvis. IMPRESSION: 1. 2.7 cm simple left ovarian cyst, most consistent with a normal physiologic follicular cyst. 2. Small right-sided hydrosalpinx. 3. No other acute abnormality within the pelvis. No evidence for torsion. 4. Prior hysterectomy. Electronically Signed   By: Jeannine Boga M.D.   On: 02/27/2018 19:07    Procedures Procedures (including critical care time)  Medications Ordered in ED Medications  morphine 4 MG/ML injection 4 mg (4 mg Intravenous Given 02/27/18 1741)  ondansetron (ZOFRAN) injection 4 mg (4 mg Intravenous Given 02/27/18 1741)  sodium chloride 0.9 % bolus 1,000 mL ( Intravenous Rate/Dose Verify 02/27/18 1930)    Initial Impression / Assessment and Plan / ED Course  I have reviewed the triage vital signs and the nursing notes.  Pertinent labs & imaging results that were available during my care of the patient were reviewed by me and considered in my medical decision making (see chart for details).   Patient presents to the emergency department with complaints of pelvic pain.  Patient nontoxic-appearing, not in apparent distress, vitals initially without significant abnormality.  On exam patient is tender in the suprapubic/pelvic area, no peritoneal signs, no upper abdominal tenderness, low suspicion for acute intra-abdominal process such as cholecystitis, appendicitis, bowel obstruction/perforation, or pancreatitis, pain seems pelvic in nature. Pelvic exam subsequently performed, diffuse tenderness noted, mild white thin discharge present that  is not purulent in appearance, no notable friability.  Will further evaluate with labs and ultrasound.   Labs have been reviewed: No leukocytosis.  Anemia stable with hemoglobin of 11.2.  Renal function and LFTs without significant abnormalities.  No significant electrolyte disturbance.  Urinalysis with bacteria, nitrite and leukocyte negative, patient without urinary symptoms, will culture. UA does appear consistent with dehydration with elevated specific gravity will provide IV fluids pending [place]. Pregnancy test negative. Wet prep with BV.   Ultrasound results: IMPRESSION: 1. 2.7 cm simple left ovarian cyst, most consistent with a normal physiologic follicular cyst. 2. Small right-sided hydrosalpinx. 3. No other acute abnormality within the pelvis. No evidence for torsion. 4. Prior hysterectomy.   19:15: RE-EVAL: Patient feeling improved  after medicines. Updated on results. Plan to consult patient's obgyn practice.   19:48: CONSULT: Discussed case with obgyn Dr. Alwyn Pea with [facility]- recommends flagyl and doxycycline for 1 week with follow up with Dr. Landry Mellow in office.   Will discharge on antibiotics, additional prescriptions for ibuprofen and tramadol for pain control, [place] Controlled Substance reporting System queried. I discussed results, treatment plan, need for  follow-up, and return precautions with the patient. Provided opportunity for questions, patient confirmed understanding and is in agreement with plan.   Findings and plan of care discussed with supervising physician Dr. Maryan Rued who is in agreement.    Final Clinical Impressions(s) / ED Diagnoses   Final diagnoses:  Pelvic pain  Cyst of left ovary  Hydrosalpinx  Bacterial vaginosis    ED Discharge Orders         Ordered    doxycycline (VIBRAMYCIN) 100 MG capsule  2 times daily     02/27/18 2008    metroNIDAZOLE (FLAGYL) 500 MG tablet  2 times daily     02/27/18 2008    ibuprofen (ADVIL,MOTRIN) 800 MG tablet  Every  8 hours PRN     02/27/18 2008    traMADol (ULTRAM) 50 MG tablet  Every 6 hours PRN     02/27/18 [address], PA-C 02/27/18 2029    Blanchie Dessert, MD 02/27/18 2354

## 2018-02-27 NOTE — ED Notes (Signed)
Called Dr. Alwyn Pea for OB-GYN consult.

## 2018-02-27 NOTE — ED Notes (Signed)
Patient transported to Ultrasound 

## 2018-02-27 NOTE — ED Notes (Signed)
ED Provider at bedside. 

## 2018-02-27 NOTE — ED Notes (Signed)
Attempted IV stick x 1 to LAC- unsuccessful

## 2018-02-28 LAB — GC/CHLAMYDIA PROBE AMP (~~LOC~~) NOT AT ARMC
Chlamydia: NEGATIVE
NEISSERIA GONORRHEA: NEGATIVE

## 2018-02-28 LAB — HIV ANTIBODY (ROUTINE TESTING W REFLEX): HIV Screen 4th Generation wRfx: NONREACTIVE

## 2018-02-28 LAB — RPR: RPR Ser Ql: NONREACTIVE

## 2018-03-01 LAB — URINE CULTURE

## 2018-04-01 ENCOUNTER — Encounter (HOSPITAL_BASED_OUTPATIENT_CLINIC_OR_DEPARTMENT_OTHER): Payer: Self-pay | Admitting: Adult Health

## 2018-04-01 ENCOUNTER — Emergency Department (HOSPITAL_BASED_OUTPATIENT_CLINIC_OR_DEPARTMENT_OTHER)
Admission: EM | Admit: 2018-04-01 | Discharge: 2018-04-01 | Disposition: A | Discharge status: Home / Self Care | Payer: BC Managed Care – PPO | Attending: Emergency Medicine | Admitting: Emergency Medicine

## 2018-04-01 ENCOUNTER — Emergency Department (HOSPITAL_BASED_OUTPATIENT_CLINIC_OR_DEPARTMENT_OTHER): Payer: BC Managed Care – PPO

## 2018-04-01 ENCOUNTER — Other Ambulatory Visit: Payer: Self-pay

## 2018-04-01 DIAGNOSIS — E039 Hypothyroidism, unspecified: Secondary | ICD-10-CM | POA: Diagnosis not present

## 2018-04-01 DIAGNOSIS — Z79899 Other long term (current) drug therapy: Secondary | ICD-10-CM | POA: Diagnosis not present

## 2018-04-01 DIAGNOSIS — M79672 Pain in left foot: Secondary | ICD-10-CM | POA: Insufficient documentation

## 2018-04-01 DIAGNOSIS — E119 Type 2 diabetes mellitus without complications: Secondary | ICD-10-CM | POA: Diagnosis not present

## 2018-04-01 MED ORDER — ACETAMINOPHEN 325 MG PO TABS
650.0000 mg | ORAL_TABLET | Freq: Once | ORAL | Status: AC
  Administered 2018-04-01: 650 mg via ORAL
  Filled 2018-04-01: qty 2

## 2018-04-01 NOTE — ED Provider Notes (Signed)
[facility] EMERGENCY DEPARTMENT Provider Note   CSN: [id number] Arrival date & time: 04/01/18  1546     History   Chief Complaint Chief Complaint  Patient presents with  . Foot Pain    HPI Peggy Mahoney is a 42 y.o. female.  HPI   Patient is a 43 year old female with history of anemia, anxiety, bronchitis, colitis, GERD, hypothyroidism, CVA, MI, who presents emergency department today for evaluation of left foot pain that began yesterday.  Patient states that she was trying to stand up after sitting on her couch, when her foot got stuck in the frame of the couch and she "felt a pop ".  Had sudden onset of 10/10 pain.  Also notes swelling to the left foot.  No breaks in the skin or other injuries.  No ankle pain.  Pain worse with ambulation.  Has not tried any interventions for her symptoms.  Past Medical History:  Diagnosis Date  . Anemia   . Anxiety   . Bronchitis    has inhaler prn  . Colitis   . GERD (gastroesophageal reflux disease)   . Hypothyroidism   . MI (myocardial infarction) ([place]) 11/08/2006  . MVP (mitral valve prolapse)   . Stroke ([place]) 01/04/2007  . SVD (spontaneous vaginal delivery)    x 3    Patient Active Problem List   Diagnosis Date Noted  . Menorrhagia 12/14/2017  . Fibroids 12/14/2017  . S/P vaginal hysterectomy 12/13/2017  . IBS (irritable bowel syndrome) 03/17/2017  . Hyperthyroidism 10/19/2016  . Diabetes [facility])     Past Surgical History:  Procedure Laterality Date  . ABDOMINAL HYSTERECTOMY    . DILATION AND CURETTAGE OF UTERUS     x 3 terminations  . VAGINAL HYSTERECTOMY Bilateral 12/13/2017   Procedure: HYSTERECTOMY VAGINAL;  Surgeon: Christophe Louis, MD;  Location: [facility];  Service: Gynecology;  Laterality: Bilateral;  . WISDOM TOOTH EXTRACTION       OB History   None      Home Medications    Prior to Admission medications   Medication Sig Start Date End Date Taking? Authorizing Provider  albuterol (PROVENTIL  HFA;VENTOLIN HFA) 108 (90 Base) MCG/ACT inhaler Inhale 1-2 puffs into the lungs every 6 (six) hours as needed for wheezing or shortness of breath.    [provider]  CRANBERRY-VITAMIN C PO Take 1 tablet by mouth daily.    [provider]  docusate sodium (COLACE) 100 MG capsule Take 100 mg by mouth 2 (two) times daily.    [provider]  doxycycline (VIBRAMYCIN) 100 MG capsule Take 1 capsule (100 mg total) by mouth 2 (two) times daily. 02/27/18   Petrucelli, Samantha R, PA-C  HYDROmorphone (DILAUDID) 2 MG tablet Take 1 tablet (2 mg total) by mouth every 4 (four) hours as needed for severe pain. 12/14/17   Christophe Louis, MD  ibuprofen (ADVIL,MOTRIN) 800 MG tablet Take 1 tablet (800 mg total) by mouth every 8 (eight) hours as needed. 02/27/18   Petrucelli, Samantha R, PA-C  methimazole (TAPAZOLE) 5 MG tablet Take 1 tablet (5 mg total) by mouth daily. 10/18/16   Renato Shin, MD  metroNIDAZOLE (FLAGYL) 500 MG tablet Take 1 tablet (500 mg total) by mouth 2 (two) times daily. 02/27/18   Petrucelli, Samantha R, PA-C  polyethylene glycol powder (GLYCOLAX) powder Take 17 g by mouth 2 (two) times daily. 12/15/17 12/15/18  Noralyn Pick, FNP  promethazine (PHENERGAN) 25 MG tablet Take 25 mg by mouth every 8 (eight)  hours as needed for nausea or vomiting.    [provider]  ranitidine (ZANTAC) 150 MG capsule Take 150 mg by mouth daily as needed for heartburn.  07/14/16   [provider]  sertraline (ZOLOFT) 100 MG tablet Take 100 mg by mouth at bedtime. Takes 100 mg at bedtime and 50 mg in the morning    [provider]  sertraline (ZOLOFT) 50 MG tablet Take 50 mg by mouth daily. Takes 50 mg int he morning and 100 mg at bedtime    [provider]  traMADol (ULTRAM) 50 MG tablet Take 1 tablet (50 mg total) by mouth every 6 (six) hours as needed. 02/27/18   Petrucelli, Glynda Jaeger, PA-C    Family History Family History  Problem Relation Age of Onset  .  Thyroid disease Neg Hx     Social History Social History   Tobacco Use  . Smoking status: Never Smoker  . Smokeless tobacco: Never Used  Substance Use Topics  . Alcohol use: No  . Drug use: No     Allergies   Naproxen and Pineapple flavor   Review of Systems Review of Systems  Constitutional: Negative for fever.  Musculoskeletal:       Left foot pain  Skin: Negative for wound.     Physical Exam Updated Vital Signs BP 131/70   Pulse 89   Temp 98.1 F (36.7 C) (Oral)   Resp 18   Ht 5\' 4"  (1.626 m)   Wt 90.3 kg   LMP 12/11/2017 (Exact Date)   SpO2 100%   BMI 34.16 kg/m   Physical Exam  Constitutional: She appears well-developed and well-nourished. No distress.  HENT:  Head: Normocephalic and atraumatic.  Eyes: Conjunctivae are normal.  Neck: Neck supple.  Cardiovascular: Normal rate.  Pulmonary/Chest: Effort normal.  Musculoskeletal:  TTP and swelling to the lateral aspect of the left foot. No erythema or skin changes. FROM and sensation intact. Brisk cap refill.  Neurological: She is alert.  Skin: Skin is warm and dry.  Psychiatric: She has a normal mood and affect.  Nursing note and vitals reviewed.    ED Treatments / Results  Labs (all labs ordered are listed, but only abnormal results are displayed) Labs Reviewed - No data to display  EKG None  Radiology Dg Foot Complete Left  Result Date: 04/01/2018 CLINICAL DATA:  Severe LEFT foot pain starting yesterday, mild swelling. EXAM: LEFT FOOT - COMPLETE 3+ VIEW COMPARISON:  None. FINDINGS: Osseous alignment is normal. Bone mineralization is normal. No fracture line or displaced fracture fragment seen. No degenerative change seen. Adjacent soft tissues are unremarkable. IMPRESSION: Negative. Electronically Signed   By: Franki Cabot M.D.   On: 04/01/2018 16:37    Procedures Procedures (including critical care time)  Medications Ordered in ED Medications  acetaminophen (TYLENOL) tablet 650 mg  (650 mg Oral Given 04/01/18 1613)     Initial Impression / Assessment and Plan / ED Course  I have reviewed the triage vital signs and the nursing notes.  Pertinent labs & imaging results that were available during my care of the patient were reviewed by me and considered in my medical decision making (see chart for details).     Final Clinical Impressions(s) / ED Diagnoses   Final diagnoses:  Foot pain, left   Patient presents with left foot pain after twisting injury that occurred yesterday.  No obvious deformity on exam.  X-ray negative for bony abnormliaty.  Will give postop shoe and  have patient follow-up with her PCP in 1 week for reevaluation and possible repeat x-rays if persistent pain to rule out occult stress fracture..  Advised her to return to the ER for any new or worsening symptoms the meantime.  She voiced understanding plan reasons to return to the ED peer all questions answered.  ED Discharge Orders    None       Bishop Dublin 04/01/18 2044    Orlie Dakin, MD 04/01/18 [phone]

## 2018-04-01 NOTE — ED Triage Notes (Addendum)
PResents with 10/10 left foot pain that began yesterday after her foot popped. Mild swelling noted to lateral aspect of left foot. CMS intact

## 2018-04-01 NOTE — Discharge Instructions (Addendum)

## 2018-07-30 ENCOUNTER — Other Ambulatory Visit: Payer: Self-pay | Admitting: Family Medicine

## 2018-07-30 DIAGNOSIS — Z1231 Encounter for screening mammogram for malignant neoplasm of breast: Secondary | ICD-10-CM

## 2018-10-01 ENCOUNTER — Ambulatory Visit: Payer: BC Managed Care – PPO

## 2018-11-16 ENCOUNTER — Ambulatory Visit: Payer: BC Managed Care – PPO

## 2018-12-24 ENCOUNTER — Ambulatory Visit
Admission: RE | Admit: 2018-12-24 | Discharge: 2018-12-24 | Disposition: A | Discharge status: Home / Self Care | Payer: BC Managed Care – PPO | Source: Ambulatory Visit | Attending: Family Medicine | Admitting: Family Medicine

## 2018-12-24 DIAGNOSIS — Z1231 Encounter for screening mammogram for malignant neoplasm of breast: Secondary | ICD-10-CM

## 2019-01-29 ENCOUNTER — Other Ambulatory Visit: Payer: Self-pay

## 2019-01-29 ENCOUNTER — Emergency Department (HOSPITAL_BASED_OUTPATIENT_CLINIC_OR_DEPARTMENT_OTHER)
Admission: EM | Admit: 2019-01-29 | Discharge: 2019-01-29 | Disposition: A | Discharge status: Home / Self Care | Payer: BC Managed Care – PPO | Attending: Emergency Medicine | Admitting: Emergency Medicine

## 2019-01-29 ENCOUNTER — Encounter (HOSPITAL_BASED_OUTPATIENT_CLINIC_OR_DEPARTMENT_OTHER): Payer: Self-pay | Admitting: Emergency Medicine

## 2019-01-29 ENCOUNTER — Emergency Department (HOSPITAL_BASED_OUTPATIENT_CLINIC_OR_DEPARTMENT_OTHER): Payer: BC Managed Care – PPO

## 2019-01-29 DIAGNOSIS — I252 Old myocardial infarction: Secondary | ICD-10-CM | POA: Insufficient documentation

## 2019-01-29 DIAGNOSIS — R0789 Other chest pain: Secondary | ICD-10-CM

## 2019-01-29 DIAGNOSIS — R079 Chest pain, unspecified: Secondary | ICD-10-CM

## 2019-01-29 DIAGNOSIS — J45909 Unspecified asthma, uncomplicated: Secondary | ICD-10-CM | POA: Diagnosis not present

## 2019-01-29 DIAGNOSIS — Z79899 Other long term (current) drug therapy: Secondary | ICD-10-CM | POA: Diagnosis not present

## 2019-01-29 DIAGNOSIS — E039 Hypothyroidism, unspecified: Secondary | ICD-10-CM | POA: Diagnosis not present

## 2019-01-29 DIAGNOSIS — Z8673 Personal history of transient ischemic attack (TIA), and cerebral infarction without residual deficits: Secondary | ICD-10-CM | POA: Diagnosis not present

## 2019-01-29 DIAGNOSIS — E119 Type 2 diabetes mellitus without complications: Secondary | ICD-10-CM | POA: Diagnosis not present

## 2019-01-29 DIAGNOSIS — R0602 Shortness of breath: Secondary | ICD-10-CM | POA: Diagnosis not present

## 2019-01-29 LAB — BASIC METABOLIC PANEL
Anion gap: 11 (ref 5–15)
BUN: 18 mg/dL (ref 6–20)
CO2: 22 mmol/L (ref 22–32)
Calcium: 9.1 mg/dL (ref 8.9–10.3)
Chloride: 103 mmol/L (ref 98–111)
Creatinine, Ser: 0.77 mg/dL (ref 0.44–1.00)
GFR calc Af Amer: >60 mL/min (ref >60)
GFR calc non Af Amer: >60 mL/min (ref >60)
Glucose, Bld: 110 mg/dL — ABNORMAL HIGH (ref 70–99)
Potassium: 3.8 mmol/L (ref 3.5–5.1)
Sodium: 136 mmol/L (ref 135–145)

## 2019-01-29 LAB — CBC WITH DIFFERENTIAL/PLATELET
Abs Immature Granulocytes: 0.02 10*3/uL (ref 0.00–0.07)
Basophils Absolute: 0 10*3/uL (ref 0.0–0.1)
Basophils Relative: 0 %
Eosinophils Absolute: 0.3 10*3/uL (ref 0.0–0.5)
Eosinophils Relative: 3 %
HCT: 44.4 % (ref 36.0–46.0)
Hemoglobin: 13.6 g/dL (ref 12.0–15.0)
Immature Granulocytes: 0 %
Lymphocytes Relative: 32 %
Lymphs Abs: 3.1 10*3/uL (ref 0.7–4.0)
MCH: 25.7 pg — ABNORMAL LOW (ref 26.0–34.0)
MCHC: 30.6 g/dL (ref 30.0–36.0)
MCV: 83.9 fL (ref 80.0–100.0)
Monocytes Absolute: 0.7 10*3/uL (ref 0.1–1.0)
Monocytes Relative: 7 %
Neutro Abs: 5.7 10*3/uL (ref 1.7–7.7)
Neutrophils Relative %: 58 %
Platelets: 268 10*3/uL (ref 150–400)
RBC: 5.29 MIL/uL — ABNORMAL HIGH (ref 3.87–5.11)
RDW: 14 % (ref 11.5–15.5)
WBC: 9.7 10*3/uL (ref 4.0–10.5)
nRBC: 0 % (ref 0.0–0.2)

## 2019-01-29 LAB — D-DIMER, QUANTITATIVE: D-Dimer, Quant: <0.27 ug/mL-FEU (ref 0.00–0.50)

## 2019-01-29 LAB — TROPONIN I (HIGH SENSITIVITY)
Troponin I (High Sensitivity): 2 ng/L (ref <18)
Troponin I (High Sensitivity): <2 ng/L (ref <18)

## 2019-01-29 MED ORDER — ONDANSETRON HCL 4 MG/2ML IJ SOLN
4.0000 mg | Freq: Once | INTRAMUSCULAR | Status: AC
  Administered 2019-01-29: 4 mg via INTRAVENOUS
  Filled 2019-01-29: qty 2

## 2019-01-29 MED ORDER — OXYCODONE-ACETAMINOPHEN 5-325 MG PO TABS
1.0000 | ORAL_TABLET | Freq: Once | ORAL | Status: AC
  Administered 2019-01-29: 1 via ORAL
  Filled 2019-01-29: qty 1

## 2019-01-29 MED ORDER — KETOROLAC TROMETHAMINE 30 MG/ML IJ SOLN
30.0000 mg | Freq: Once | INTRAMUSCULAR | Status: AC
  Administered 2019-01-29: 30 mg via INTRAVENOUS
  Filled 2019-01-29: qty 1

## 2019-01-29 NOTE — ED Provider Notes (Signed)
Care assumed from Dr. Dina Rich at shift change.  Patient awaiting results of a delta troponin.  Patient presented here with complaints of chest discomfort that appears noncardiac in nature.  Her EKG is unchanged and troponin x2 is negative.  She also has a negative d-dimer and clear chest x-ray.  Etiology suspected is musculoskeletal.  Patient appears appropriate for discharge with PRN follow-up/return.   Veryl Speak, MD 01/29/19 [phone]

## 2019-01-29 NOTE — ED Notes (Signed)
Patient transported to X-ray 

## 2019-01-29 NOTE — ED Provider Notes (Signed)
[facility] EMERGENCY DEPARTMENT Provider Note   CSN: [id number] Arrival date & time: 01/29/19  2536    History   Chief Complaint Chief Complaint  Patient presents with  . Chest Pain    HPI Peggy Mahoney is a 43 y.o. female.     HPI  This 43 year old female with a history of reflux, mitral valve prolapse who presents with chest pain.  Patient reports that she woke up with sudden onset sternal chest pain that radiates into the right chest.  She states it feels like "I am being cracked like a crab."  She reports pain is worse with deep breathing.  Denies fevers or cough.  States she felt well when she went to bed.  Does report some associated shortness of breath.  No exertional symptoms.  Denies hypertension, hyperlipidemia, diabetes, smoking history.  Currently she rates her pain at 5 out of 10.  Denies any recent travel, leg swelling, history of blood clots.  Past Medical History:  Diagnosis Date  . Anemia   . Anxiety   . Bronchitis    has inhaler prn  . Colitis   . GERD (gastroesophageal reflux disease)   . Hypothyroidism   . MI (myocardial infarction) ([place]) 11/08/2006  . MVP (mitral valve prolapse)   . Stroke ([place]) 01/04/2007  . SVD (spontaneous vaginal delivery)    x 3    Patient Active Problem List   Diagnosis Date Noted  . Menorrhagia 12/14/2017  . Fibroids 12/14/2017  . S/P vaginal hysterectomy 12/13/2017  . IBS (irritable bowel syndrome) 03/17/2017  . Hyperthyroidism 10/19/2016  . Diabetes [facility])     Past Surgical History:  Procedure Laterality Date  . ABDOMINAL HYSTERECTOMY    . DILATION AND CURETTAGE OF UTERUS     x 3 terminations  . VAGINAL HYSTERECTOMY Bilateral 12/13/2017   Procedure: HYSTERECTOMY VAGINAL;  Surgeon: Christophe Louis, MD;  Location: [facility];  Service: Gynecology;  Laterality: Bilateral;  . WISDOM TOOTH EXTRACTION       OB History   No obstetric history on file.      Home Medications    Prior to Admission  medications   Medication Sig Start Date End Date Taking? Authorizing Provider  albuterol (PROVENTIL HFA;VENTOLIN HFA) 108 (90 Base) MCG/ACT inhaler Inhale 1-2 puffs into the lungs every 6 (six) hours as needed for wheezing or shortness of breath.    [provider]  CRANBERRY-VITAMIN C PO Take 1 tablet by mouth daily.    [provider]  docusate sodium (COLACE) 100 MG capsule Take 100 mg by mouth 2 (two) times daily.    [provider]  doxycycline (VIBRAMYCIN) 100 MG capsule Take 1 capsule (100 mg total) by mouth 2 (two) times daily. 02/27/18   Petrucelli, Samantha R, PA-C  HYDROmorphone (DILAUDID) 2 MG tablet Take 1 tablet (2 mg total) by mouth every 4 (four) hours as needed for severe pain. 12/14/17   Christophe Louis, MD  ibuprofen (ADVIL,MOTRIN) 800 MG tablet Take 1 tablet (800 mg total) by mouth every 8 (eight) hours as needed. 02/27/18   Petrucelli, Samantha R, PA-C  methimazole (TAPAZOLE) 5 MG tablet Take 1 tablet (5 mg total) by mouth daily. 10/18/16   Renato Shin, MD  metroNIDAZOLE (FLAGYL) 500 MG tablet Take 1 tablet (500 mg total) by mouth 2 (two) times daily. 02/27/18   Petrucelli, Glynda Jaeger, PA-C  promethazine (PHENERGAN) 25 MG tablet Take 25 mg by mouth every 8 (eight) hours as needed for nausea or vomiting.  [provider]  ranitidine (ZANTAC) 150 MG capsule Take 150 mg by mouth daily as needed for heartburn.  07/14/16   [provider]  sertraline (ZOLOFT) 100 MG tablet Take 100 mg by mouth at bedtime. Takes 100 mg at bedtime and 50 mg in the morning    [provider]  sertraline (ZOLOFT) 50 MG tablet Take 50 mg by mouth daily. Takes 50 mg int he morning and 100 mg at bedtime    [provider]  traMADol (ULTRAM) 50 MG tablet Take 1 tablet (50 mg total) by mouth every 6 (six) hours as needed. 02/27/18   Petrucelli, Glynda Jaeger, PA-C    Family History Family History  Problem Relation Age of Onset  . Cancer Mother   .  Diabetes Mother   . Hypertension Mother   . Hyperlipidemia Mother   . Glaucoma Mother   . Cancer Father   . Diabetes Father   . Hyperlipidemia Father   . Hypertension Father   . Heart attack Father   . Thyroid disease Neg Hx     Social History Social History   Tobacco Use  . Smoking status: Never Smoker  . Smokeless tobacco: Never Used  Substance Use Topics  . Alcohol use: No  . Drug use: No     Allergies   Naproxen and Pineapple flavor   Review of Systems Review of Systems  Constitutional: Negative for fever.  Respiratory: Positive for shortness of breath. Negative for cough.   Cardiovascular: Positive for chest pain. Negative for leg swelling.  Gastrointestinal: Positive for nausea. Negative for abdominal pain, diarrhea and vomiting.  Genitourinary: Negative for dysuria.  Neurological: Negative for weakness and numbness.  All other systems reviewed and are negative.    Physical Exam Updated Vital Signs BP 117/72   Pulse 72   Resp 13   Ht 1.626 m (5\' 4" )   Wt 88 kg   LMP 12/11/2017 (Exact Date)   SpO2 98%   BMI 33.30 kg/m   Physical Exam Vitals signs and nursing note reviewed.  Constitutional:      Appearance: She is well-developed.  HENT:     Head: Normocephalic and atraumatic.  Eyes:     Pupils: Pupils are equal, round, and reactive to light.  Neck:     Musculoskeletal: Neck supple.  Cardiovascular:     Rate and Rhythm: Normal rate and regular rhythm.     Heart sounds: Normal heart sounds.  Pulmonary:     Effort: Pulmonary effort is normal. No respiratory distress.     Breath sounds: No wheezing.  Chest:     Chest wall: No tenderness.  Abdominal:     General: Bowel sounds are normal.     Palpations: Abdomen is soft.  Musculoskeletal:     Right lower leg: She exhibits no tenderness. No edema.     Left lower leg: She exhibits no tenderness. No edema.  Skin:    General: Skin is warm and dry.  Neurological:     Mental Status: She is alert  and oriented to person, place, and time.  Psychiatric:        Mood and Affect: Mood normal.      ED Treatments / Results  Labs (all labs ordered are listed, but only abnormal results are displayed) Labs Reviewed  CBC WITH DIFFERENTIAL/PLATELET - Abnormal; Notable for the following components:      Result Value   RBC 5.29 (*)    MCH 25.7 (*)  All other components within normal limits  BASIC METABOLIC PANEL - Abnormal; Notable for the following components:   Glucose, Bld 110 (*)    All other components within normal limits  D-DIMER, QUANTITATIVE (NOT AT [facility])  TROPONIN I (HIGH SENSITIVITY)  TROPONIN I (HIGH SENSITIVITY)    EKG EKG Interpretation  Date/Time:  Tuesday January 29 2019 05:00:20 EDT Ventricular Rate:  93 PR Interval:    QRS Duration: 74 QT Interval:  368 QTC Calculation: 458 R Axis:   29 Text Interpretation:  Sinus rhythm Low voltage, precordial leads Borderline T abnormalities, diffuse leads Baseline wander in lead(s) V5 No significant change since last tracing Confirmed by Thayer Jew [phone]) on 01/29/2019 5:28:04 AM   Radiology Dg Chest 2 View  Result Date: 01/29/2019 CLINICAL DATA:  Chest pain EXAM: CHEST - 2 VIEW COMPARISON:  11/04/2012 chest x-ray report FINDINGS: Normal heart size and mediastinal contours. No acute infiltrate or edema. No effusion or pneumothorax. No acute osseous findings. IMPRESSION: Negative chest. Electronically Signed   By: Monte Fantasia M.D.   On: 01/29/2019 06:39    Procedures Procedures (including critical care time)  Angiocath insertion Performed by: Merryl Hacker  Consent: Verbal consent obtained. Risks and benefits: risks, benefits and alternatives were discussed Time out: Immediately prior to procedure a "time out" was called to verify the correct patient, procedure, equipment, support staff and site/side marked as required.  Preparation: Patient was prepped and draped in the usual sterile fashion.  Vein  Location: left basilic  Ultrasound Guided  Gauge: 20  Normal blood return and flush without difficulty Patient tolerance: Patient tolerated the procedure well with no immediate complications.     Medications Ordered in ED Medications  ketorolac (TORADOL) 30 MG/ML injection 30 mg (30 mg Intravenous Given 01/29/19 0534)  ondansetron (ZOFRAN) injection 4 mg (4 mg Intravenous Given 01/29/19 0534)  oxyCODONE-acetaminophen (PERCOCET/ROXICET) 5-325 MG per tablet 1 tablet (1 tablet Oral Given 01/29/19 0631)     Initial Impression / Assessment and Plan / ED Course  I have reviewed the triage vital signs and the nursing notes.  Pertinent labs & imaging results that were available during my care of the patient were reviewed by me and considered in my medical decision making (see chart for details).        Patient presents with acute onset of chest pain.  Fairly atypical in nature.  Pleuritic.  She is overall nontoxic-appearing and vital signs are reassuring.  She is low risk for ACS.  EKG without evidence of ischemia or arrhythmia.  Initial troponin is reassuring.  Screening d-dimer was obtained given the pleuritic nature of the pain.  She is otherwise low risk for PE.  D-dimer is negative.  No further work-up indicated.  Chest x-ray shows no evidence of pneumothorax or pneumonia.  Patient is comfortable after a dose of Toradol.  Recommend repeat troponin at 2 hours.  If negative, will discharge home.  Suspect musculoskeletal versus GI etiology.  Final Clinical Impressions(s) / ED Diagnoses   Final diagnoses:  Atypical chest pain    ED Discharge Orders    None       Deontre Allsup, Barbette Hair, MD 01/29/19 [phone]

## 2019-01-29 NOTE — Discharge Instructions (Addendum)
Ibuprofen 600 mg every 6 hours as needed for pain.  Follow-up with your primary doctor in the next 3 to 4 days if symptoms or not improving, and return to the ER if you develop worsening pain, difficulty breathing, high fever, or other new and concerning symptoms.

## 2019-01-29 NOTE — ED Triage Notes (Signed)
Pt states she is having chest pain that started about 45 minutes ago  Pt states the pain is in her breast bone and a little to the right  States it is hard to take a deep breath  Has some nausea but no vomiting

## 2019-01-29 NOTE — ED Notes (Signed)
ED Provider at bedside discussing test results and dispo plan of care. 

## 2019-01-29 NOTE — ED Notes (Signed)
PT states sharp pain under right breast with moving arms. States chest xray was painful when she had to lift her arms up. Skin warm and dry. No SOB or labored breathing.

## 2019-04-04 LAB — TSH: TSH: 0.69 (ref 0.41–5.90)

## 2019-08-09 ENCOUNTER — Emergency Department (HOSPITAL_BASED_OUTPATIENT_CLINIC_OR_DEPARTMENT_OTHER)
Admission: EM | Admit: 2019-08-09 | Discharge: 2019-08-09 | Disposition: A | Discharge status: Home / Self Care | Payer: BC Managed Care – PPO | Attending: Emergency Medicine | Admitting: Emergency Medicine

## 2019-08-09 ENCOUNTER — Other Ambulatory Visit: Payer: Self-pay

## 2019-08-09 ENCOUNTER — Encounter (HOSPITAL_BASED_OUTPATIENT_CLINIC_OR_DEPARTMENT_OTHER): Payer: Self-pay | Admitting: Emergency Medicine

## 2019-08-09 DIAGNOSIS — E039 Hypothyroidism, unspecified: Secondary | ICD-10-CM | POA: Insufficient documentation

## 2019-08-09 DIAGNOSIS — R0789 Other chest pain: Secondary | ICD-10-CM | POA: Diagnosis not present

## 2019-08-09 DIAGNOSIS — R42 Dizziness and giddiness: Secondary | ICD-10-CM | POA: Insufficient documentation

## 2019-08-09 DIAGNOSIS — Z79899 Other long term (current) drug therapy: Secondary | ICD-10-CM | POA: Insufficient documentation

## 2019-08-09 LAB — BASIC METABOLIC PANEL
Anion gap: 8 (ref 5–15)
BUN: 20 mg/dL (ref 6–20)
CO2: 24 mmol/L (ref 22–32)
Calcium: 8.7 mg/dL — ABNORMAL LOW (ref 8.9–10.3)
Chloride: 105 mmol/L (ref 98–111)
Creatinine, Ser: 0.89 mg/dL (ref 0.44–1.00)
GFR calc Af Amer: >60 mL/min (ref >60)
GFR calc non Af Amer: >60 mL/min (ref >60)
Glucose, Bld: 118 mg/dL — ABNORMAL HIGH (ref 70–99)
Potassium: 4 mmol/L (ref 3.5–5.1)
Sodium: 137 mmol/L (ref 135–145)

## 2019-08-09 LAB — CBC WITH DIFFERENTIAL/PLATELET
Abs Immature Granulocytes: 0.04 10*3/uL (ref 0.00–0.07)
Basophils Absolute: 0 10*3/uL (ref 0.0–0.1)
Basophils Relative: 0 %
Eosinophils Absolute: 0.2 10*3/uL (ref 0.0–0.5)
Eosinophils Relative: 2 %
HCT: 42.5 % (ref 36.0–46.0)
Hemoglobin: 13.4 g/dL (ref 12.0–15.0)
Immature Granulocytes: 0 %
Lymphocytes Relative: 28 %
Lymphs Abs: 2.7 10*3/uL (ref 0.7–4.0)
MCH: 26.2 pg (ref 26.0–34.0)
MCHC: 31.5 g/dL (ref 30.0–36.0)
MCV: 83 fL (ref 80.0–100.0)
Monocytes Absolute: 0.6 10*3/uL (ref 0.1–1.0)
Monocytes Relative: 6 %
Neutro Abs: 6.3 10*3/uL (ref 1.7–7.7)
Neutrophils Relative %: 64 %
Platelets: 322 10*3/uL (ref 150–400)
RBC: 5.12 MIL/uL — ABNORMAL HIGH (ref 3.87–5.11)
RDW: 13.9 % (ref 11.5–15.5)
WBC: 9.8 10*3/uL (ref 4.0–10.5)
nRBC: 0 % (ref 0.0–0.2)

## 2019-08-09 LAB — TROPONIN I (HIGH SENSITIVITY): Troponin I (High Sensitivity): <2 ng/L (ref <18)

## 2019-08-09 MED ORDER — MECLIZINE HCL 25 MG PO TABS: 25.0000 mg | ORAL_TABLET | Freq: Three times a day (TID) | ORAL | 0 refills | Status: DC | PRN

## 2019-08-09 MED ORDER — MELOXICAM 15 MG PO TABS: ORAL_TABLET | ORAL | 0 refills | Status: DC

## 2019-08-09 MED ORDER — ONDANSETRON HCL 4 MG/2ML IJ SOLN
4.0000 mg | Freq: Once | INTRAMUSCULAR | Status: AC
  Administered 2019-08-09: 4 mg via INTRAVENOUS
  Filled 2019-08-09: qty 2

## 2019-08-09 MED ORDER — KETOROLAC TROMETHAMINE 15 MG/ML IJ SOLN
15.0000 mg | Freq: Once | INTRAMUSCULAR | Status: AC
  Administered 2019-08-09: 15 mg via INTRAVENOUS
  Filled 2019-08-09: qty 1

## 2019-08-09 MED ORDER — DIPHENHYDRAMINE HCL 50 MG/ML IJ SOLN
25.0000 mg | Freq: Once | INTRAMUSCULAR | Status: AC
  Administered 2019-08-09: 25 mg via INTRAVENOUS
  Filled 2019-08-09: qty 1

## 2019-08-09 NOTE — ED Triage Notes (Signed)
Pt states she has had dizziness for about a week  Pt has had chest pain on the left side for a couple of days  Pt states she checked her blood pressure tonight around 8pm and it was 146/90

## 2019-08-09 NOTE — ED Provider Notes (Signed)
[facility] DEPT [facility] Provider Note: Georgena Spurling, MD, FACEP  CSN: [id number] MRN: [id number] ARRIVAL: 08/09/19 at [place]: [id number]/[id number]   CHIEF COMPLAINT  Dizziness and Chest Pain   HISTORY OF PRESENT ILLNESS  08/09/19 3:47 AM Peggy Mahoney is a 44 y.o. female who has had dizziness for about a week.  By dizziness she means a sensation of the room is spinning (when she is sitting) or that she is off balance (when standing).  Symptoms are worse with movement of the head.  She occasionally gets mild nausea but never severe nausea or vomiting.  She has not taken anything for the symptoms.  She is also had some left lower anterior chest pain for a couple of days.  She describes this pain as feeling like someone squeezing the balloon.  She rates it as a 6 out of 10.  It is worse with palpation of that area of the chest.  She denies shortness of breath or change in pain with breathing.   Past Medical History:  Diagnosis Date  . Anemia   . Anxiety   . Bronchitis    has inhaler prn  . Colitis   . GERD (gastroesophageal reflux disease)   . Hypothyroidism   . MI (myocardial infarction) ([place]) 11/08/2006  . MVP (mitral valve prolapse)   . Stroke ([place]) 01/04/2007  . SVD (spontaneous vaginal delivery)    x 3    Past Surgical History:  Procedure Laterality Date  . ABDOMINAL HYSTERECTOMY    . DILATION AND CURETTAGE OF UTERUS     x 3 terminations  . VAGINAL HYSTERECTOMY Bilateral 12/13/2017   Procedure: HYSTERECTOMY VAGINAL;  Surgeon: Christophe Louis, MD;  Location: [facility];  Service: Gynecology;  Laterality: Bilateral;  . WISDOM TOOTH EXTRACTION      Family History  Problem Relation Age of Onset  . Cancer Mother   . Diabetes Mother   . Hypertension Mother   . Hyperlipidemia Mother   . Glaucoma Mother   . Cancer Father   . Diabetes Father   . Hyperlipidemia Father   . Hypertension Father   . Heart attack Father   . Thyroid disease Neg Hx     Social History   Tobacco Use   . Smoking status: Never Smoker  . Smokeless tobacco: Never Used  Substance Use Topics  . Alcohol use: No  . Drug use: No    Prior to Admission medications   Medication Sig Start Date End Date Taking? Authorizing Provider  albuterol (PROVENTIL HFA;VENTOLIN HFA) 108 (90 Base) MCG/ACT inhaler Inhale 1-2 puffs into the lungs every 6 (six) hours as needed for wheezing or shortness of breath.    [provider]  CRANBERRY-VITAMIN C PO Take 1 tablet by mouth daily.    [provider]  docusate sodium (COLACE) 100 MG capsule Take 100 mg by mouth 2 (two) times daily.    [provider]  doxycycline (VIBRAMYCIN) 100 MG capsule Take 1 capsule (100 mg total) by mouth 2 (two) times daily. 02/27/18   Petrucelli, Samantha R, PA-C  HYDROmorphone (DILAUDID) 2 MG tablet Take 1 tablet (2 mg total) by mouth every 4 (four) hours as needed for severe pain. 12/14/17   Christophe Louis, MD  ibuprofen (ADVIL,MOTRIN) 800 MG tablet Take 1 tablet (800 mg total) by mouth every 8 (eight) hours as needed. 02/27/18   Petrucelli, Samantha R, PA-C  methimazole (TAPAZOLE) 5 MG tablet Take 1 tablet (5 mg total) by mouth daily. 10/18/16  Renato Shin, MD  metroNIDAZOLE (FLAGYL) 500 MG tablet Take 1 tablet (500 mg total) by mouth 2 (two) times daily. 02/27/18   Petrucelli, Glynda Jaeger, PA-C  promethazine (PHENERGAN) 25 MG tablet Take 25 mg by mouth every 8 (eight) hours as needed for nausea or vomiting.    [provider]  ranitidine (ZANTAC) 150 MG capsule Take 150 mg by mouth daily as needed for heartburn.  07/14/16   [provider]  sertraline (ZOLOFT) 100 MG tablet Take 100 mg by mouth at bedtime. Takes 100 mg at bedtime and 50 mg in the morning    [provider]  sertraline (ZOLOFT) 50 MG tablet Take 50 mg by mouth daily. Takes 50 mg int he morning and 100 mg at bedtime    [provider]  traMADol (ULTRAM) 50 MG tablet Take 1 tablet (50 mg total) by mouth every 6 (six)  hours as needed. 02/27/18   Petrucelli, Samantha R, PA-C    Allergies Naproxen and Pineapple flavor   REVIEW OF SYSTEMS  Negative except as noted here or in the History of Present Illness.   PHYSICAL EXAMINATION  Initial Vital Signs Blood pressure 118/73, temperature 98.5 F (36.9 C), temperature source Oral, resp. rate 18, height 5' 4.75" (1.645 m), weight 97.5 kg, last menstrual period 12/11/2017, SpO2 100 %.  Examination General: Well-developed, well-nourished female in no acute distress; appearance consistent with age of record HENT: normocephalic; atraumatic Eyes: pupils equal, round and reactive to light; extraocular muscles intact; no nystagmus Neck: supple Heart: regular rate and rhythm Lungs: clear to auscultation bilaterally Abdomen: soft; nondistended; nontender; bowel sounds present Extremities: No deformity; full range of motion; pulses normal Neurologic: Awake, alert and oriented; motor function intact in all extremities and symmetric; no facial droop Skin: Warm and dry Psychiatric: Normal mood and affect   RESULTS  Summary of this visit's results, reviewed and interpreted by myself:   EKG Interpretation  Date/Time:  Friday August 09 2019 03:47:31 EST Ventricular Rate:  79 PR Interval:    QRS Duration: 69 QT Interval:  374 QTC Calculation: 429 R Axis:   29 Text Interpretation: Sinus rhythm Low voltage, precordial leads Rate is slower Confirmed by Audreyanna Butkiewicz [phone]) on 08/09/2019 3:52:57 AM      Laboratory Studies: Results for orders placed or performed during the hospital encounter of 08/09/19 (from the past 24 hour(s))  CBC with Differential/Platelet     Status: Abnormal   Collection Time: 08/09/19  3:56 AM  Result Value Ref Range   WBC 9.8 4.0 - 10.5 K/uL   RBC 5.12 (H) 3.87 - 5.11 MIL/uL   Hemoglobin 13.4 12.0 - 15.0 g/dL   HCT 42.5 36.0 - 46.0 %   MCV 83.0 80.0 - 100.0 fL   MCH 26.2 26.0 - 34.0 pg   MCHC 31.5 30.0 - 36.0 g/dL   RDW 13.9  11.5 - 15.5 %   Platelets 322 150 - 400 K/uL   nRBC 0.0 0.0 - 0.2 %   Neutrophils Relative % 64 %   Neutro Abs 6.3 1.7 - 7.7 K/uL   Lymphocytes Relative 28 %   Lymphs Abs 2.7 0.7 - 4.0 K/uL   Monocytes Relative 6 %   Monocytes Absolute 0.6 0.1 - 1.0 K/uL   Eosinophils Relative 2 %   Eosinophils Absolute 0.2 0.0 - 0.5 K/uL   Basophils Relative 0 %   Basophils Absolute 0.0 0.0 - 0.1 K/uL   Immature Granulocytes 0 %   Abs Immature Granulocytes  0.04 0.00 - 0.07 K/uL  Basic metabolic panel     Status: Abnormal   Collection Time: 08/09/19  3:56 AM  Result Value Ref Range   Sodium 137 135 - 145 mmol/L   Potassium 4.0 3.5 - 5.1 mmol/L   Chloride 105 98 - 111 mmol/L   CO2 24 22 - 32 mmol/L   Glucose, Bld 118 (H) 70 - 99 mg/dL   BUN 20 6 - 20 mg/dL   Creatinine, Ser 0.89 0.44 - 1.00 mg/dL   Calcium 8.7 (L) 8.9 - 10.3 mg/dL   GFR calc non Af Amer >60 >60 mL/min   GFR calc Af Amer >60 >60 mL/min   Anion gap 8 5 - 15  Troponin I (High Sensitivity)     Status: None   Collection Time: 08/09/19  3:56 AM  Result Value Ref Range   Troponin I (High Sensitivity) <2 <18 ng/L   Imaging Studies: No results found.  ED COURSE and MDM  Nursing notes, initial and subsequent vitals signs, including pulse oximetry, reviewed and interpreted by myself.  Vitals:   08/09/19 0337 08/09/19 0339  BP: 118/73   Resp: 18   Temp: 98.5 F (36.9 C)   TempSrc: Oral   SpO2: 100%   Weight:  97.5 kg  Height:  5' 4.75" (1.645 m)   Medications  ondansetron (ZOFRAN) injection 4 mg (4 mg Intravenous Given 08/09/19 0402)  diphenhydrAMINE (BENADRYL) injection 25 mg (25 mg Intravenous Given 08/09/19 0403)  ketorolac (TORADOL) 15 MG/ML injection 15 mg (15 mg Intravenous Given 08/09/19 0403)   4:39 AM Chest wall pain improved with IV Toradol and patient's vertigo significantly improved with IV Benadryl.  Will place patient on Mobic and meclizine for symptom control and refer back to her PCP.   PROCEDURES    Procedures   ED DIAGNOSES     ICD-10-CM   1. Vertigo  R42   2. Chest wall pain  R07.89        Shanon Rosser, MD 08/09/19 [phone]

## 2019-08-30 ENCOUNTER — Other Ambulatory Visit: Payer: Self-pay | Admitting: Family Medicine

## 2019-08-30 ENCOUNTER — Ambulatory Visit
Admission: RE | Admit: 2019-08-30 | Discharge: 2019-08-30 | Disposition: A | Discharge status: Home / Self Care | Payer: BC Managed Care – PPO | Source: Ambulatory Visit | Attending: Family Medicine | Admitting: Family Medicine

## 2019-08-30 DIAGNOSIS — R591 Generalized enlarged lymph nodes: Secondary | ICD-10-CM

## 2019-09-19 ENCOUNTER — Ambulatory Visit (HOSPITAL_BASED_OUTPATIENT_CLINIC_OR_DEPARTMENT_OTHER)
Admission: RE | Admit: 2019-09-19 | Discharge: 2019-09-19 | Disposition: A | Discharge status: Home / Self Care | Payer: BC Managed Care – PPO | Source: Ambulatory Visit | Attending: Family Medicine | Admitting: Family Medicine

## 2019-09-19 ENCOUNTER — Other Ambulatory Visit (HOSPITAL_BASED_OUTPATIENT_CLINIC_OR_DEPARTMENT_OTHER): Payer: Self-pay | Admitting: Family Medicine

## 2019-09-19 ENCOUNTER — Other Ambulatory Visit: Payer: Self-pay

## 2019-09-19 DIAGNOSIS — R1032 Left lower quadrant pain: Secondary | ICD-10-CM | POA: Insufficient documentation

## 2019-11-25 ENCOUNTER — Other Ambulatory Visit: Payer: Self-pay | Admitting: Family Medicine

## 2019-11-25 DIAGNOSIS — Z1231 Encounter for screening mammogram for malignant neoplasm of breast: Secondary | ICD-10-CM

## 2019-12-25 ENCOUNTER — Other Ambulatory Visit: Payer: Self-pay

## 2019-12-25 ENCOUNTER — Ambulatory Visit
Admission: RE | Admit: 2019-12-25 | Discharge: 2019-12-25 | Disposition: A | Discharge status: Home / Self Care | Payer: BC Managed Care – PPO | Source: Ambulatory Visit | Attending: Family Medicine | Admitting: Family Medicine

## 2019-12-25 DIAGNOSIS — Z1231 Encounter for screening mammogram for malignant neoplasm of breast: Secondary | ICD-10-CM

## 2020-05-21 ENCOUNTER — Encounter: Payer: Self-pay | Admitting: General Practice

## 2020-06-22 ENCOUNTER — Emergency Department (HOSPITAL_BASED_OUTPATIENT_CLINIC_OR_DEPARTMENT_OTHER)
Admission: EM | Admit: 2020-06-22 | Discharge: 2020-06-22 | Disposition: A | Discharge status: Home / Self Care | Payer: BC Managed Care – PPO | Attending: Emergency Medicine | Admitting: Emergency Medicine

## 2020-06-22 ENCOUNTER — Encounter (HOSPITAL_BASED_OUTPATIENT_CLINIC_OR_DEPARTMENT_OTHER): Payer: Self-pay

## 2020-06-22 ENCOUNTER — Other Ambulatory Visit: Payer: Self-pay

## 2020-06-22 DIAGNOSIS — R0789 Other chest pain: Secondary | ICD-10-CM | POA: Diagnosis not present

## 2020-06-22 DIAGNOSIS — E119 Type 2 diabetes mellitus without complications: Secondary | ICD-10-CM | POA: Diagnosis not present

## 2020-06-22 DIAGNOSIS — R079 Chest pain, unspecified: Secondary | ICD-10-CM | POA: Diagnosis present

## 2020-06-22 DIAGNOSIS — Z8673 Personal history of transient ischemic attack (TIA), and cerebral infarction without residual deficits: Secondary | ICD-10-CM | POA: Diagnosis not present

## 2020-06-22 DIAGNOSIS — E039 Hypothyroidism, unspecified: Secondary | ICD-10-CM | POA: Diagnosis not present

## 2020-06-22 DIAGNOSIS — R590 Localized enlarged lymph nodes: Secondary | ICD-10-CM | POA: Insufficient documentation

## 2020-06-22 LAB — BASIC METABOLIC PANEL
Anion gap: 9 (ref 5–15)
BUN: 13 mg/dL (ref 6–20)
CO2: 23 mmol/L (ref 22–32)
Calcium: 8.6 mg/dL — ABNORMAL LOW (ref 8.9–10.3)
Chloride: 105 mmol/L (ref 98–111)
Creatinine, Ser: 0.81 mg/dL (ref 0.44–1.00)
GFR, Estimated: >60 mL/min (ref >60)
Glucose, Bld: 100 mg/dL — ABNORMAL HIGH (ref 70–99)
Potassium: 4 mmol/L (ref 3.5–5.1)
Sodium: 137 mmol/L (ref 135–145)

## 2020-06-22 LAB — CBC
HCT: 38 % (ref 36.0–46.0)
Hemoglobin: 12.1 g/dL (ref 12.0–15.0)
MCH: 26.3 pg (ref 26.0–34.0)
MCHC: 31.8 g/dL (ref 30.0–36.0)
MCV: 82.6 fL (ref 80.0–100.0)
Platelets: 329 10*3/uL (ref 150–400)
RBC: 4.6 MIL/uL (ref 3.87–5.11)
RDW: 14.3 % (ref 11.5–15.5)
WBC: 11 10*3/uL — ABNORMAL HIGH (ref 4.0–10.5)
nRBC: 0 % (ref 0.0–0.2)

## 2020-06-22 LAB — TROPONIN I (HIGH SENSITIVITY)
Troponin I (High Sensitivity): <2 ng/L (ref <18)
Troponin I (High Sensitivity): <2 ng/L (ref <18)

## 2020-06-22 MED ORDER — DICLOFENAC SODIUM 1 % EX GEL: 4.0000 g | Freq: Four times a day (QID) | CUTANEOUS | 0 refills | Status: DC

## 2020-06-22 NOTE — ED Provider Notes (Signed)
[facility] EMERGENCY DEPARTMENT Provider Note   CSN: [id number] Arrival date & time: 06/22/20  1454     History Chief Complaint  Patient presents with  . Chest Pain    Peggy Mahoney is a 45 y.o. female.  44 yo F with a chief complaint of chest pain.  This is the upper part of her chest.  Worse with certain positions.  Nothing seems to make it better.  Denies cough congestion or fever.  Denies abdominal pain.  Denies nausea vomiting or diarrhea.  Was babysitting her grandchild yesterday and thought initially that she had pulled a muscle but because it had not improved today she said to come to the ED for evaluation.  Described as sharp.  Radiates somewhat outward from the center.  Patient does have a history of an MI.  She does not think it feels like it did before.  Last heart attack was many years ago when she became quite diaphoretic while at work but about being sent immediately to the hospital.  She denies history of PE or DVT denies hemoptysis denies unilateral lower extremity edema denies recent surgery immobilization or hospitalization.  Denies estrogen use.  Denies history of cancer.  The history is provided by the patient.  Chest Pain Pain location:  L chest Pain quality: aching and sharp   Pain radiates to:  Neck Pain severity:  Moderate Onset quality:  Sudden Duration:  2 days Timing:  Constant Progression:  Worsening Chronicity:  New Relieved by:  Nothing Worsened by:  Nothing Ineffective treatments:  None tried Associated symptoms: no dizziness, no fever, no headache, no nausea, no palpitations, no shortness of breath and no vomiting        Past Medical History:  Diagnosis Date  . Anemia   . Anxiety   . Bronchitis    has inhaler prn  . Colitis   . GERD (gastroesophageal reflux disease)   . Hypothyroidism   . MI (myocardial infarction) ([facility]) 11/08/2006  . MVP (mitral valve prolapse)   . Stroke ([facility]) 01/04/2007  . SVD (spontaneous  vaginal delivery)    x 3    Patient Active Problem List   Diagnosis Date Noted  . Menorrhagia 12/14/2017  . Fibroids 12/14/2017  . S/P vaginal hysterectomy 12/13/2017  . IBS (irritable bowel syndrome) 03/17/2017  . Hyperthyroidism 10/19/2016  . Diabetes [facility])     Past Surgical History:  Procedure Laterality Date  . ABDOMINAL HYSTERECTOMY    . DILATION AND CURETTAGE OF UTERUS     x 3 terminations  . VAGINAL HYSTERECTOMY Bilateral 12/13/2017   Procedure: HYSTERECTOMY VAGINAL;  Surgeon: Gerald Leitz, MD;  Location: [facility];  Service: Gynecology;  Laterality: Bilateral;  . WISDOM TOOTH EXTRACTION       OB History   No obstetric history on file.     Family History  Problem Relation Age of Onset  . Cancer Mother   . Diabetes Mother   . Hypertension Mother   . Hyperlipidemia Mother   . Glaucoma Mother   . Cancer Father   . Diabetes Father   . Hyperlipidemia Father   . Hypertension Father   . Heart attack Father   . Thyroid disease Neg Hx     Social History   Tobacco Use  . Smoking status: Never Smoker  . Smokeless tobacco: Never Used  Vaping Use  . Vaping Use: Never used  Substance Use Topics  . Alcohol use: No  . Drug use: No  Home Medications Prior to Admission medications   Medication Sig Start Date End Date Taking? Authorizing Provider  diclofenac Sodium (VOLTAREN) 1 % GEL Apply 4 g topically 4 (four) times daily. 06/22/20  Yes Deno Etienne, DO  albuterol (PROVENTIL HFA;VENTOLIN HFA) 108 (90 Base) MCG/ACT inhaler Inhale 1-2 puffs into the lungs every 6 (six) hours as needed for wheezing or shortness of breath.    [provider]  docusate sodium (COLACE) 100 MG capsule Take 100 mg by mouth 2 (two) times daily.    [provider]  meclizine (ANTIVERT) 25 MG tablet Take 1 tablet (25 mg total) by mouth 3 (three) times daily as needed for dizziness. 08/09/19   Molpus, John, MD  meloxicam (MOBIC) 15 MG tablet Take 1 tablet daily as needed for chest  wall pain. 08/09/19   Molpus, John, MD  methimazole (TAPAZOLE) 5 MG tablet Take 1 tablet (5 mg total) by mouth daily. 10/18/16   Renato Shin, MD  ranitidine (ZANTAC) 150 MG capsule Take 150 mg by mouth daily as needed for heartburn.  07/14/16   [provider]  sertraline (ZOLOFT) 100 MG tablet Take 100 mg by mouth at bedtime. Takes 100 mg at bedtime and 50 mg in the morning    [provider]  sertraline (ZOLOFT) 50 MG tablet Take 50 mg by mouth daily. Takes 50 mg int he morning and 100 mg at bedtime    [provider]  promethazine (PHENERGAN) 25 MG tablet Take 25 mg by mouth every 8 (eight) hours as needed for nausea or vomiting.  08/09/19  [provider]    Allergies    Naproxen and Pineapple flavor  Review of Systems   Review of Systems  Constitutional: Negative for chills and fever.  HENT: Negative for congestion and rhinorrhea.   Eyes: Negative for redness and visual disturbance.  Respiratory: Negative for shortness of breath and wheezing.   Cardiovascular: Positive for chest pain. Negative for palpitations.  Gastrointestinal: Negative for nausea and vomiting.  Genitourinary: Negative for dysuria and urgency.  Musculoskeletal: Negative for arthralgias and myalgias.  Skin: Negative for pallor and wound.  Neurological: Negative for dizziness and headaches.    Physical Exam Updated Vital Signs BP (!) 111/50 (BP Location: Left Arm)   Pulse 82   Temp 98.1 F (36.7 C) (Oral)   Resp 20   Ht 5\' 4"  (1.626 m)   Wt 99.3 kg   LMP 12/11/2017 (Exact Date)   SpO2 100%   BMI 37.59 kg/m   Physical Exam Vitals and nursing note reviewed.  Constitutional:      General: She is not in acute distress.    Appearance: She is well-developed and well-nourished. She is obese. She is not diaphoretic.  HENT:     Head: Normocephalic and atraumatic.  Eyes:     Extraocular Movements: EOM normal.     Pupils: Pupils are equal, round, and reactive to light.   Neck:     Comments: Patient points to an area would expect to have a left anterior cervical lymph node.  She does have a very small palpable node there.  Does not appear to be very tender.  No fluctuance no induration and it is mobile.  Similar to the other side.  Posterior oropharynx without erythema edema or drainage.  No tonsillar swelling.  No sublingual swelling.  No obvious dental abnormality.  Left TM is normal without bulging or effusion. Cardiovascular:     Rate and Rhythm: Normal rate and regular  rhythm.     Heart sounds: No murmur heard. No friction rub. No gallop.   Pulmonary:     Effort: Pulmonary effort is normal.     Breath sounds: No wheezing or rales.  Chest:     Chest wall: Tenderness (tenderness to the upper portion of her chest about the sternum ribs 2-4 reproduces pain) present.  Abdominal:     General: There is no distension.     Palpations: Abdomen is soft.     Tenderness: There is no abdominal tenderness.  Musculoskeletal:        General: No tenderness or edema.     Cervical back: Normal range of motion and neck supple.  Skin:    General: Skin is warm and dry.  Neurological:     Mental Status: She is alert and oriented to person, place, and time.  Psychiatric:        Mood and Affect: Mood and affect normal.        Behavior: Behavior normal.     ED Results / Procedures / Treatments   Labs (all labs ordered are listed, but only abnormal results are displayed) Labs Reviewed  BASIC METABOLIC PANEL - Abnormal; Notable for the following components:      Result Value   Glucose, Bld 100 (*)    Calcium 8.6 (*)    All other components within normal limits  CBC - Abnormal; Notable for the following components:   WBC 11.0 (*)    All other components within normal limits  TROPONIN I (HIGH SENSITIVITY)  TROPONIN I (HIGH SENSITIVITY)    EKG EKG Interpretation  Date/Time:  Monday June 22 2020 15:02:35 EST Ventricular Rate:  91 PR Interval:  142 QRS  Duration: 66 QT Interval:  344 QTC Calculation: 423 R Axis:   6 Text Interpretation: Normal sinus rhythm Normal ECG No significant change since last tracing Confirmed by Deno Etienne [phone]) on 06/22/2020 7:42:44 PM   Radiology No results found.  Procedures Procedures (including critical care time)  Medications Ordered in ED Medications - No data to display  ED Course  I have reviewed the triage vital signs and the nursing notes.  Pertinent labs & imaging results that were available during my care of the patient were reviewed by me and considered in my medical decision making (see chart for details).    MDM Rules/Calculators/A&P                          45 yo F with a chief complaint of chest pain.  Patient does have a history of MI though this was remotely and her symptoms are very atypical of ACS.  Chest pain is reproduced on exam.  EKG without concerning change.  Has 2 troponins that are completely negative.  No significant anemia no significant electrolyte abnormality.  Chest x-ray viewed by me without focal infiltrate or pneumothorax.  The most likely the patient has musculoskeletal chest pain.  Will treat as such.  She is also complaining of some left anterior neck pain.  No obvious edema.  Has been going on for some time.  Had seen her family doctor for it a few months ago.  I suggested she bring this back up with her family physician as they may want to obtain further testing.  8:05 PM:  I have discussed the diagnosis/risks/treatment options with the patient and believe the pt to be eligible for discharge home to follow-up with PCP, cards. We also  discussed returning to the ED immediately if new or worsening sx occur. We discussed the sx which are most concerning (e.g., sudden worsening pain, fever, inability to tolerate by mouth) that necessitate immediate return. Medications administered to the patient during their visit and any new prescriptions provided to the patient are listed  below.  Medications given during this visit Medications - No data to display   The patient appears reasonably screen and/or stabilized for discharge and I doubt any other medical condition or other [facility] requiring further screening, evaluation, or treatment in the ED at this time prior to discharge.   Final Clinical Impression(s) / ED Diagnoses Final diagnoses:  Atypical chest pain  Left cervical lymphadenopathy    Rx / DC Orders ED Discharge Orders         Ordered    diclofenac Sodium (VOLTAREN) 1 % GEL  4 times daily        06/22/20 1959           Mikia Delaluz, DO 06/22/20 2005

## 2020-06-22 NOTE — Discharge Instructions (Signed)
Take tylenol 1000mg (2 extra strength) four times a day. Use the gel as prescribed  Return for symptoms that worsen when you exert yourself or if they make you sweaty or remind you of your prior heart attack.

## 2020-06-22 NOTE — ED Triage Notes (Signed)
Pt c/o central CP and "lump in my throat" started last night-NAD-steady gait

## 2020-07-09 ENCOUNTER — Emergency Department (HOSPITAL_BASED_OUTPATIENT_CLINIC_OR_DEPARTMENT_OTHER)
Admission: EM | Admit: 2020-07-09 | Discharge: 2020-07-09 | Disposition: A | Discharge status: Home / Self Care | Payer: BC Managed Care – PPO | Attending: Emergency Medicine | Admitting: Emergency Medicine

## 2020-07-09 ENCOUNTER — Emergency Department (HOSPITAL_BASED_OUTPATIENT_CLINIC_OR_DEPARTMENT_OTHER): Payer: BC Managed Care – PPO

## 2020-07-09 ENCOUNTER — Other Ambulatory Visit: Payer: Self-pay

## 2020-07-09 ENCOUNTER — Encounter (HOSPITAL_BASED_OUTPATIENT_CLINIC_OR_DEPARTMENT_OTHER): Payer: Self-pay | Admitting: *Deleted

## 2020-07-09 DIAGNOSIS — M542 Cervicalgia: Secondary | ICD-10-CM | POA: Insufficient documentation

## 2020-07-09 DIAGNOSIS — E039 Hypothyroidism, unspecified: Secondary | ICD-10-CM | POA: Insufficient documentation

## 2020-07-09 DIAGNOSIS — M25511 Pain in right shoulder: Secondary | ICD-10-CM | POA: Insufficient documentation

## 2020-07-09 DIAGNOSIS — Z79899 Other long term (current) drug therapy: Secondary | ICD-10-CM | POA: Insufficient documentation

## 2020-07-09 DIAGNOSIS — M25531 Pain in right wrist: Secondary | ICD-10-CM | POA: Insufficient documentation

## 2020-07-09 DIAGNOSIS — M546 Pain in thoracic spine: Secondary | ICD-10-CM | POA: Insufficient documentation

## 2020-07-09 DIAGNOSIS — W000XXA Fall on same level due to ice and snow, initial encounter: Secondary | ICD-10-CM | POA: Insufficient documentation

## 2020-07-09 DIAGNOSIS — W19XXXA Unspecified fall, initial encounter: Secondary | ICD-10-CM

## 2020-07-09 DIAGNOSIS — E119 Type 2 diabetes mellitus without complications: Secondary | ICD-10-CM | POA: Insufficient documentation

## 2020-07-09 DIAGNOSIS — M7918 Myalgia, other site: Secondary | ICD-10-CM

## 2020-07-09 DIAGNOSIS — M25521 Pain in right elbow: Secondary | ICD-10-CM | POA: Diagnosis present

## 2020-07-09 MED ORDER — FENTANYL CITRATE (PF) 100 MCG/2ML IJ SOLN
50.0000 ug | Freq: Once | INTRAMUSCULAR | Status: AC
  Administered 2020-07-09: 50 ug via INTRAMUSCULAR
  Filled 2020-07-09: qty 2

## 2020-07-09 MED ORDER — METHOCARBAMOL 500 MG PO TABS: 500.0000 mg | ORAL_TABLET | Freq: Two times a day (BID) | ORAL | 0 refills | Status: DC

## 2020-07-09 MED ORDER — ACETAMINOPHEN 325 MG PO TABS
650.0000 mg | ORAL_TABLET | Freq: Once | ORAL | Status: AC
  Administered 2020-07-09: 650 mg via ORAL
  Filled 2020-07-09: qty 2

## 2020-07-09 NOTE — ED Provider Notes (Signed)
[facility] EMERGENCY DEPARTMENT Provider Note   CSN: [id number] Arrival date & time: 07/09/20  1809     History Chief Complaint  Patient presents with  . Fall    Peggy Mahoney is a 45 y.o. female.  HPI   45 year old female history of anemia, anxiety, bronchitis, colitis, GERD, hypothyroidism, MI, mitral valve prolapse, stroke, SVT, who presents to the emergency department today for evaluation of a fall.  States she slipped on the ice and fell.  She hit her head on a door.  She denies any loss of consciousness but complains of nausea, dizziness and feeling woozy.  She is also complaining of some tingling to her right hand.  She states she landed on her right side and is hurting to her right shoulder, upper arm, upper back and right ribs.  She denies any pain to the lower extremities.  She denies any shortness of breath.  She is not anticoagulated.  Past Medical History:  Diagnosis Date  . Anemia   . Anxiety   . Bronchitis    has inhaler prn  . Colitis   . GERD (gastroesophageal reflux disease)   . Hypothyroidism   . MI (myocardial infarction) ([place]) 11/08/2006  . MVP (mitral valve prolapse)   . Stroke ([place]) 01/04/2007  . SVD (spontaneous vaginal delivery)    x 3    Patient Active Problem List   Diagnosis Date Noted  . Menorrhagia 12/14/2017  . Fibroids 12/14/2017  . S/P vaginal hysterectomy 12/13/2017  . IBS (irritable bowel syndrome) 03/17/2017  . Hyperthyroidism 10/19/2016  . Diabetes [facility])     Past Surgical History:  Procedure Laterality Date  . ABDOMINAL HYSTERECTOMY    . DILATION AND CURETTAGE OF UTERUS     x 3 terminations  . VAGINAL HYSTERECTOMY Bilateral 12/13/2017   Procedure: HYSTERECTOMY VAGINAL;  Surgeon: Christophe Louis, MD;  Location: [facility];  Service: Gynecology;  Laterality: Bilateral;  . WISDOM TOOTH EXTRACTION       OB History   No obstetric history on file.     Family History  Problem Relation Age of Onset  . Cancer Mother    . Diabetes Mother   . Hypertension Mother   . Hyperlipidemia Mother   . Glaucoma Mother   . Cancer Father   . Diabetes Father   . Hyperlipidemia Father   . Hypertension Father   . Heart attack Father   . Thyroid disease Neg Hx     Social History   Tobacco Use  . Smoking status: Never Smoker  . Smokeless tobacco: Never Used  Vaping Use  . Vaping Use: Never used  Substance Use Topics  . Alcohol use: No  . Drug use: No    Home Medications Prior to Admission medications   Medication Sig Start Date End Date Taking? Authorizing Provider  methocarbamol (ROBAXIN) 500 MG tablet Take 1 tablet (500 mg total) by mouth 2 (two) times daily. 07/09/20  Yes Maryruth Apple S, PA-C  albuterol (PROVENTIL HFA;VENTOLIN HFA) 108 (90 Base) MCG/ACT inhaler Inhale 1-2 puffs into the lungs every 6 (six) hours as needed for wheezing or shortness of breath.    [provider]  diclofenac Sodium (VOLTAREN) 1 % GEL Apply 4 g topically 4 (four) times daily. 06/22/20   Deno Etienne, DO  docusate sodium (COLACE) 100 MG capsule Take 100 mg by mouth 2 (two) times daily.    [provider]  meclizine (ANTIVERT) 25 MG tablet Take 1 tablet (25 mg total) by  mouth 3 (three) times daily as needed for dizziness. 08/09/19   Molpus, John, MD  meloxicam (MOBIC) 15 MG tablet Take 1 tablet daily as needed for chest wall pain. 08/09/19   Molpus, John, MD  methimazole (TAPAZOLE) 5 MG tablet Take 1 tablet (5 mg total) by mouth daily. 10/18/16   Renato Shin, MD  ranitidine (ZANTAC) 150 MG capsule Take 150 mg by mouth daily as needed for heartburn.  07/14/16   [provider]  sertraline (ZOLOFT) 100 MG tablet Take 100 mg by mouth at bedtime. Takes 100 mg at bedtime and 50 mg in the morning    [provider]  sertraline (ZOLOFT) 50 MG tablet Take 50 mg by mouth daily. Takes 50 mg int he morning and 100 mg at bedtime    [provider]  promethazine (PHENERGAN) 25 MG tablet Take 25 mg by  mouth every 8 (eight) hours as needed for nausea or vomiting.  08/09/19  [provider]    Allergies    Naproxen and Pineapple flavor  Review of Systems   Review of Systems  Constitutional: Negative for fever.  HENT: Negative for ear pain and sore throat.   Eyes: Negative for visual disturbance.  Respiratory: Negative for cough and shortness of breath.   Cardiovascular: Negative for chest pain.  Gastrointestinal: Negative for abdominal pain and vomiting.  Genitourinary: Negative for flank pain.  Musculoskeletal: Positive for back pain and neck pain.       Rue pain  Skin: Negative for color change and rash.  Neurological: Positive for headaches.       Head injury, no loc.   All other systems reviewed and are negative.   Physical Exam Updated Vital Signs BP 125/61   Pulse 79   Temp 97.9 F (36.6 C) (Oral)   Resp 18   Ht 5\' 4"  (1.626 m)   Wt 100.3 kg   LMP 12/11/2017 (Exact Date)   SpO2 100%   BMI 37.95 kg/m   Physical Exam Vitals and nursing note reviewed.  Constitutional:      General: She is not in acute distress.    Appearance: She is well-developed and well-nourished.  HENT:     Head: Normocephalic and atraumatic.  Eyes:     Conjunctiva/sclera: Conjunctivae normal.  Cardiovascular:     Rate and Rhythm: Normal rate and regular rhythm.     Heart sounds: Normal heart sounds. No murmur heard.   Pulmonary:     Effort: Pulmonary effort is normal. No respiratory distress.     Breath sounds: Normal breath sounds. No wheezing, rhonchi or rales.  Abdominal:     General: Bowel sounds are normal.     Palpations: Abdomen is soft.     Tenderness: There is no abdominal tenderness.  Musculoskeletal:        General: No edema.     Cervical back: Neck supple.     Comments: TTP to the cervical and thoracic spine.  TTP over the right shoulder area and proximal humerus.  TTP diffusely over the right elbow and right wrist.  Skin:    General: Skin is warm and dry.   Neurological:     Mental Status: She is alert.  Psychiatric:        Mood and Affect: Mood and affect normal.     ED Results / Procedures / Treatments   Labs (all labs ordered are listed, but only abnormal results are displayed) Labs Reviewed - No data to display  EKG None  Radiology DG Chest 2 View  Result Date: 07/09/2020 CLINICAL DATA:  Fall EXAM: CHEST - 2 VIEW COMPARISON:  01/29/2019 FINDINGS: The heart size and mediastinal contours are within normal limits. Both lungs are clear. The visualized skeletal structures are unremarkable. IMPRESSION: No active cardiopulmonary disease. Electronically Signed   By: Donavan Foil M.D.   On: 07/09/2020 21:49   DG Thoracic Spine 2 View  Result Date: 07/09/2020 CLINICAL DATA:  Fall EXAM: THORACIC SPINE 2 VIEWS COMPARISON:  None. FINDINGS: Minimal scoliosis. Sagittal alignment within normal limits. Vertebral body heights are maintained. There are minimal degenerative changes IMPRESSION: No acute osseous abnormality. Electronically Signed   By: Donavan Foil M.D.   On: 07/09/2020 21:48   DG Shoulder Right  Result Date: 07/09/2020 CLINICAL DATA:  Fall EXAM: RIGHT SHOULDER - 2+ VIEW COMPARISON:  06/02/2014 FINDINGS: There is no evidence of fracture or dislocation. There is no evidence of arthropathy or other focal bone abnormality. Soft tissues are unremarkable. IMPRESSION: Negative. Electronically Signed   By: Donavan Foil M.D.   On: 07/09/2020 21:49   DG Elbow Complete Right  Result Date: 07/09/2020 CLINICAL DATA:  Fall EXAM: RIGHT ELBOW - COMPLETE 3+ VIEW COMPARISON:  None. FINDINGS: No fracture or malalignment. Minimal spurring at the coronoid process. No significant elbow effusion IMPRESSION: No acute osseous abnormality. Electronically Signed   By: Donavan Foil M.D.   On: 07/09/2020 21:50   DG Wrist Complete Right  Result Date: 07/09/2020 CLINICAL DATA:  Fall EXAM: RIGHT WRIST - COMPLETE 3+ VIEW COMPARISON:  None. FINDINGS: No fracture  or malalignment. Intercarpal degenerative change at the capitate lunate interval. IMPRESSION: No acute osseous abnormality. Electronically Signed   By: Donavan Foil M.D.   On: 07/09/2020 21:51   CT Head Wo Contrast  Result Date: 07/09/2020 CLINICAL DATA:  Slipped on ice and fell with injury to the back of head, neck, right shoulder and arm EXAM: CT HEAD WITHOUT CONTRAST CT CERVICAL SPINE WITHOUT CONTRAST TECHNIQUE: Multidetector CT imaging of the head and cervical spine was performed following the standard protocol without intravenous contrast. Multiplanar CT image reconstructions of the cervical spine were also generated. COMPARISON:  None FINDINGS: CT HEAD FINDINGS Brain: No evidence of acute infarction, hemorrhage, hydrocephalus, extra-axial collection, visible mass lesion or mass effect. Prominence of the extra-axial CSF filled space across the bilateral frontal convexities inter hemispheric fissure may reflect some frontal predominant, age advanced volume loss; hygroma or chronic subdural is less favored. Vascular: No hyperdense vessel or unexpected calcification. Skull: No calvarial fracture or suspicious osseous lesion. No scalp swelling or hematoma. Sinuses/Orbits: Paranasal sinuses and mastoid air cells are predominantly clear. Included orbital structures are unremarkable. Other: None. CT CERVICAL SPINE FINDINGS Alignment: Cervical stabilization collar is absent at the time of examination. There is slight cervical flexion which likely contributes to reversal the normal cervical lordosis. No evidence of traumatic listhesis. No abnormally widened, perched or jumped facets. Normal alignment of the craniocervical and atlantoaxial articulations accounting for leftward cranial rotation. Skull base and vertebrae: No acute skull base fracture. No vertebral body fracture or height loss. Normal bone mineralization. No worrisome osseous lesions. More mild to moderate arthrosis at the atlantodental interval  including several corticated likely degenerative mineralization is. Additional cervical spondylitic changes, as detailed below. Soft tissues and spinal canal: No pre or paravertebral fluid or swelling. No visible canal hematoma. Disc levels: Multilevel intervertebral disc height loss with spondylitic endplate changes. Some shallow multilevel disc osteophyte complexes efface the ventral thecal sac without significant  canal impingement. Multilevel uncinate spurring and facet hypertrophic changes result in mild-to-moderate multilevel neural foraminal narrowing most pronounced bilaterally C5-C7. Upper chest: No acute abnormality in the upper chest or imaged lung apices. Other: Normal thyroid. IMPRESSION: 1. No acute intracranial abnormality. No scalp swelling or calvarial fracture. 2. Prominence of the extra-axial CSF filled space across the bilateral frontal convexities inter hemispheric fissure may reflect some frontal predominant, age advanced volume loss; hygroma or chronic subdural is less favored. 3. No acute cervical spine fracture or traumatic listhesis. 4. Multilevel degenerative changes of the cervical spine, as above. Electronically Signed   By: Lovena Le M.D.   On: 07/09/2020 21:24   CT Cervical Spine Wo Contrast  Result Date: 07/09/2020 CLINICAL DATA:  Slipped on ice and fell with injury to the back of head, neck, right shoulder and arm EXAM: CT HEAD WITHOUT CONTRAST CT CERVICAL SPINE WITHOUT CONTRAST TECHNIQUE: Multidetector CT imaging of the head and cervical spine was performed following the standard protocol without intravenous contrast. Multiplanar CT image reconstructions of the cervical spine were also generated. COMPARISON:  None FINDINGS: CT HEAD FINDINGS Brain: No evidence of acute infarction, hemorrhage, hydrocephalus, extra-axial collection, visible mass lesion or mass effect. Prominence of the extra-axial CSF filled space across the bilateral frontal convexities inter hemispheric fissure  may reflect some frontal predominant, age advanced volume loss; hygroma or chronic subdural is less favored. Vascular: No hyperdense vessel or unexpected calcification. Skull: No calvarial fracture or suspicious osseous lesion. No scalp swelling or hematoma. Sinuses/Orbits: Paranasal sinuses and mastoid air cells are predominantly clear. Included orbital structures are unremarkable. Other: None. CT CERVICAL SPINE FINDINGS Alignment: Cervical stabilization collar is absent at the time of examination. There is slight cervical flexion which likely contributes to reversal the normal cervical lordosis. No evidence of traumatic listhesis. No abnormally widened, perched or jumped facets. Normal alignment of the craniocervical and atlantoaxial articulations accounting for leftward cranial rotation. Skull base and vertebrae: No acute skull base fracture. No vertebral body fracture or height loss. Normal bone mineralization. No worrisome osseous lesions. More mild to moderate arthrosis at the atlantodental interval including several corticated likely degenerative mineralization is. Additional cervical spondylitic changes, as detailed below. Soft tissues and spinal canal: No pre or paravertebral fluid or swelling. No visible canal hematoma. Disc levels: Multilevel intervertebral disc height loss with spondylitic endplate changes. Some shallow multilevel disc osteophyte complexes efface the ventral thecal sac without significant canal impingement. Multilevel uncinate spurring and facet hypertrophic changes result in mild-to-moderate multilevel neural foraminal narrowing most pronounced bilaterally C5-C7. Upper chest: No acute abnormality in the upper chest or imaged lung apices. Other: Normal thyroid. IMPRESSION: 1. No acute intracranial abnormality. No scalp swelling or calvarial fracture. 2. Prominence of the extra-axial CSF filled space across the bilateral frontal convexities inter hemispheric fissure may reflect some  frontal predominant, age advanced volume loss; hygroma or chronic subdural is less favored. 3. No acute cervical spine fracture or traumatic listhesis. 4. Multilevel degenerative changes of the cervical spine, as above. Electronically Signed   By: Lovena Le M.D.   On: 07/09/2020 21:24    Procedures Procedures (including critical care time)  Medications Ordered in ED Medications  fentaNYL (SUBLIMAZE) injection 50 mcg (has no administration in time range)  acetaminophen (TYLENOL) tablet 650 mg (650 mg Oral Given 07/09/20 2103)    ED Course  I have reviewed the triage vital signs and the nursing notes.  Pertinent labs & imaging results that were available during my care of the  patient were reviewed by me and considered in my medical decision making (see chart for details).    MDM Rules/Calculators/A&P                          45 year old female presenting for evaluation after mechanical fall.  Sustained head trauma complaining of dizziness, nausea and wheeziness.  Also complaining of various areas of pain.  Will obtain CT head, cervical spine and imaging of her chest and right upper extremity  CT head/cervical spine - 1. No acute intracranial abnormality. No scalp swelling or calvarial fracture. 2. Prominence of the extra-axial CSF filled space across the bilateral frontal convexities inter hemispheric fissure may reflect some frontal predominant, age advanced volume loss; hygroma or chronic subdural is less favored. 3. No acute cervical spine fracture or traumatic listhesis. 4. Multilevel degenerative changes of the cervical spine, as above.  Xray thoracic spine, right shoulder, right elbow, right wrist did not show any acute bony abnormalities.  Patient advised to use over-the-counter pain medications for his symptoms.  Advised on RICE protocol.  Advised on close follow-up and strict return precautions.  Will give a ambulatory referral for neurology given her head CT findings.  Advised  on strict return precautions.  She voices understanding plan reasons to return. all Questions answered.  Patient stable for discharge.   Final Clinical Impression(s) / ED Diagnoses Final diagnoses:  Fall, initial encounter  Musculoskeletal pain    Rx / DC Orders ED Discharge Orders         Ordered    methocarbamol (ROBAXIN) 500 MG tablet  2 times daily        07/09/20 2216           Rodney Booze, PA-C 07/09/20 2216    Drenda Freeze, MD 07/10/20 [phone]

## 2020-07-09 NOTE — ED Triage Notes (Signed)
She slipped on ice and fell. Injury to the back of her head, neck, right shoulder and arm.

## 2020-07-09 NOTE — Discharge Instructions (Signed)
You may alternate taking Tylenol as needed for pain control. You may take [phone] mg of Tylenol every 6 hours. Do not exceed 4000 mg of Tylenol daily as this can lead to liver damage. You may use warm and cold compresses to help with your symptoms.   You were given a prescription for Robaxin which is a muscle relaxer.  You should not drive, work, or operate machinery while taking this medication as it can make you very drowsy.    Please follow up with your primary care provider within 5-7 days for re-evaluation of your symptoms. If you do not have a primary care provider, information for a healthcare clinic has been provided for you to make arrangements for follow up care. Please return to the emergency department for any new or worsening symptoms.

## 2020-08-13 ENCOUNTER — Ambulatory Visit: Payer: BC Managed Care – PPO | Admitting: Neurology

## 2020-08-16 IMAGING — US US SOFT TISSUE HEAD/NECK
1 series · 11 of 11 positions shown · non-contrast
Comparison: None.

CLINICAL DATA: Left neck swelling, palpable nodule, dysphagia

EXAM:
ULTRASOUND OF HEAD/NECK SOFT TISSUES
TECHNIQUE: Ultrasound examination of the head and neck soft tissues was
performed in the area of clinical concern.

[Series 1: us soft tissue head/neck · 0.03mm/px · 11 of 11 slices shown]
[im 1/11]
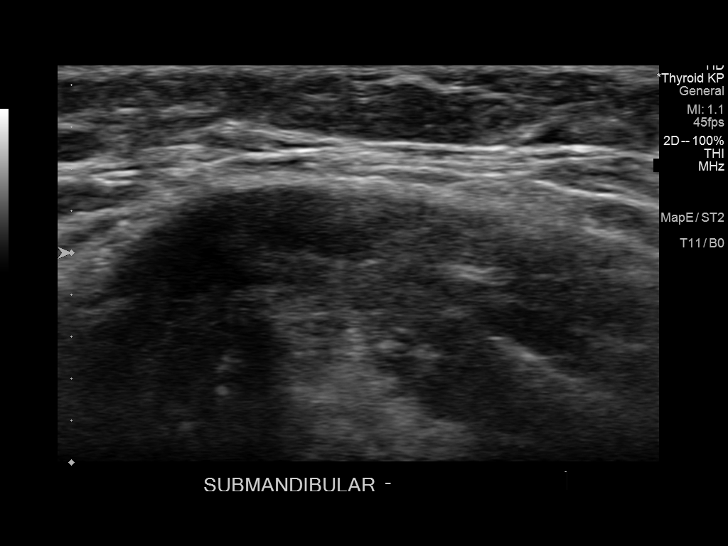
[im 2/11]
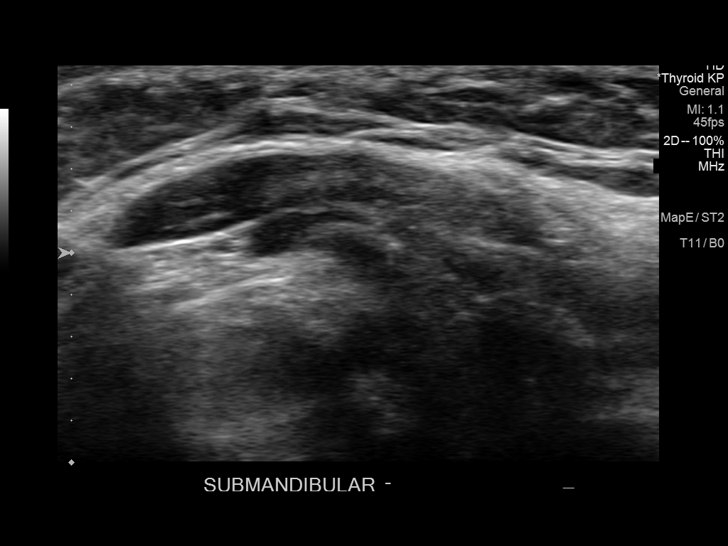
[im 3/11]
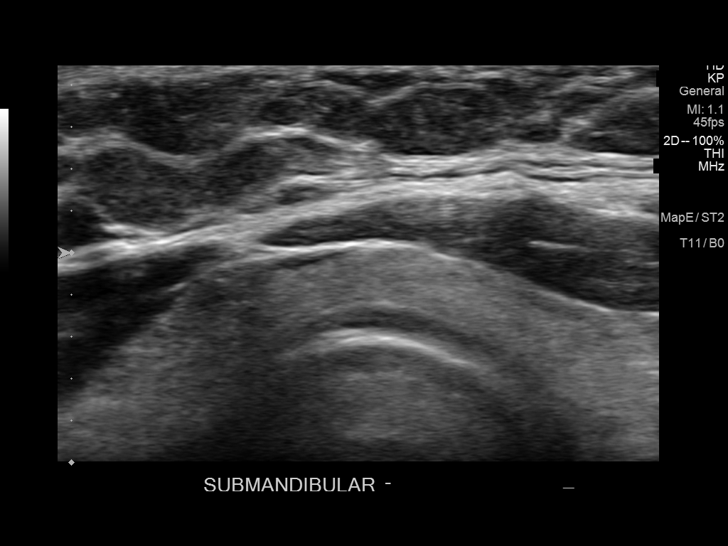
[im 4/11]
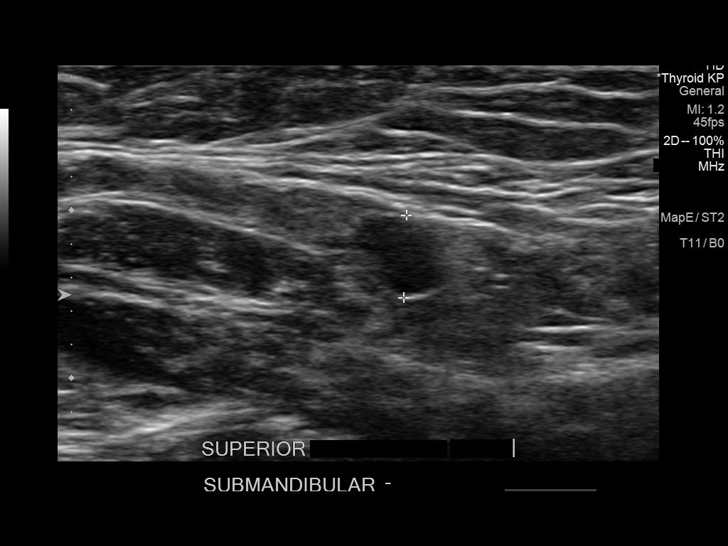
[im 5/11]
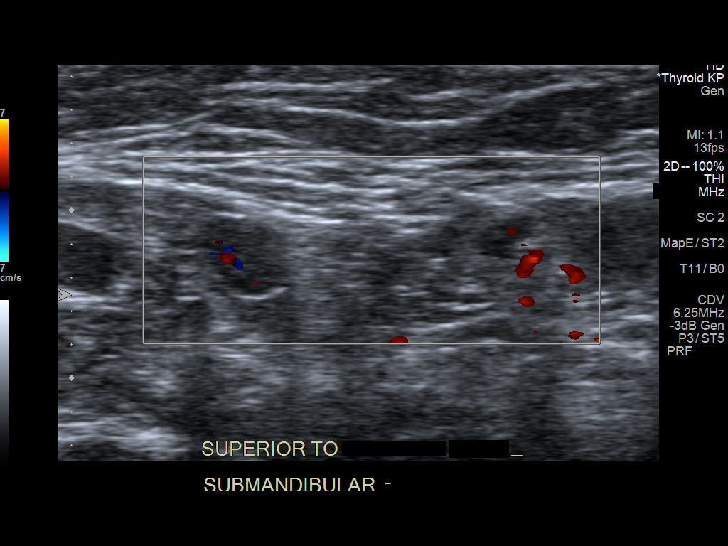
[im 6/11]
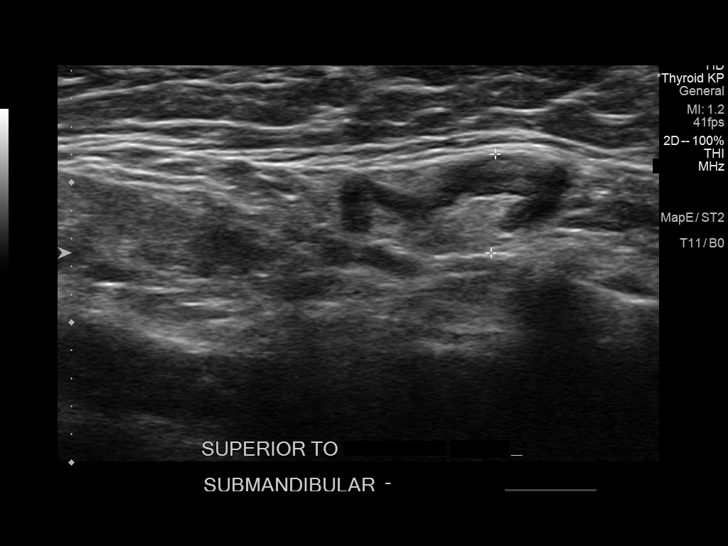
[im 7/11]
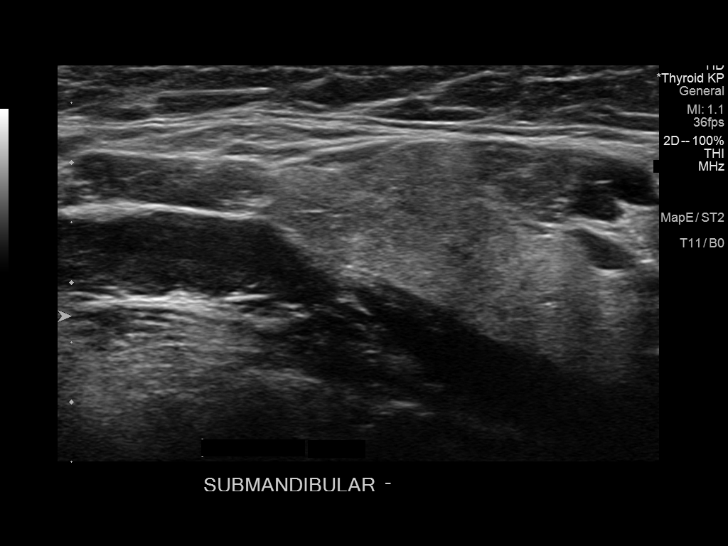
[im 8/11]
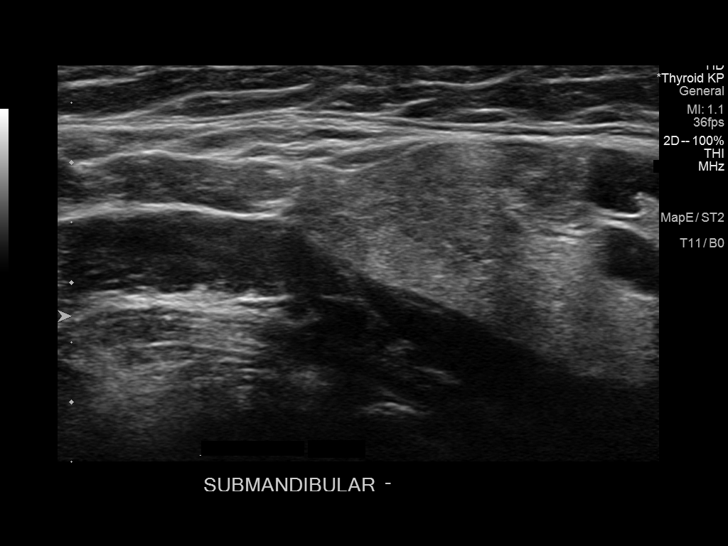
[im 9/11]
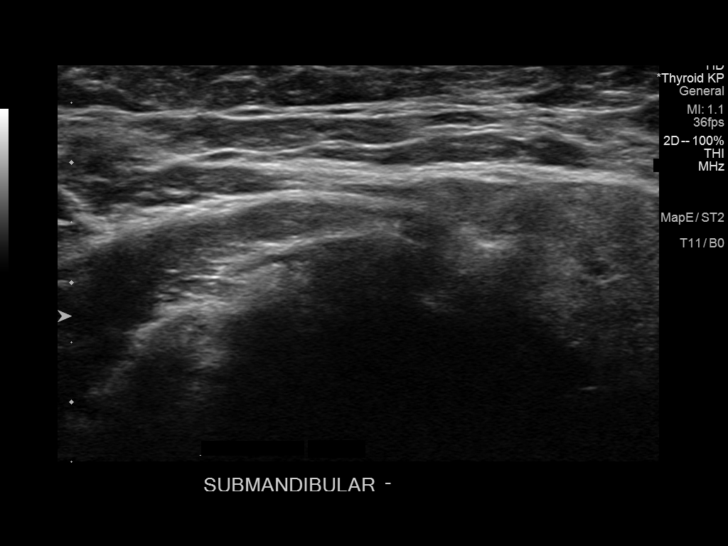
[im 10/11]
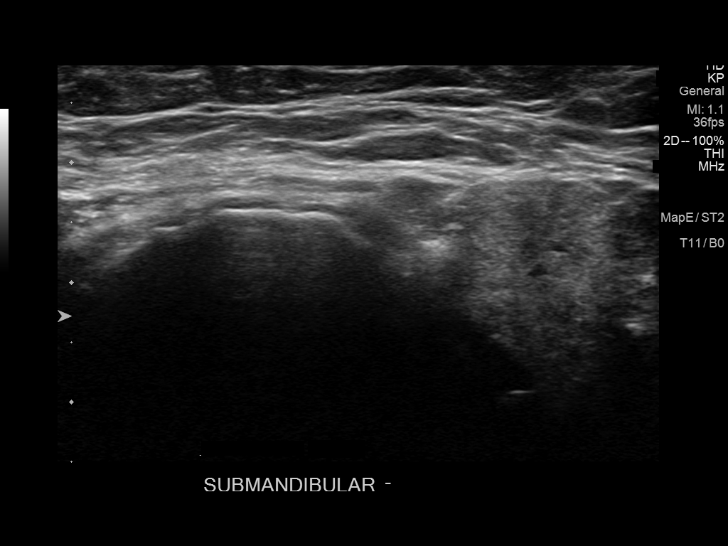
[im 11/11]
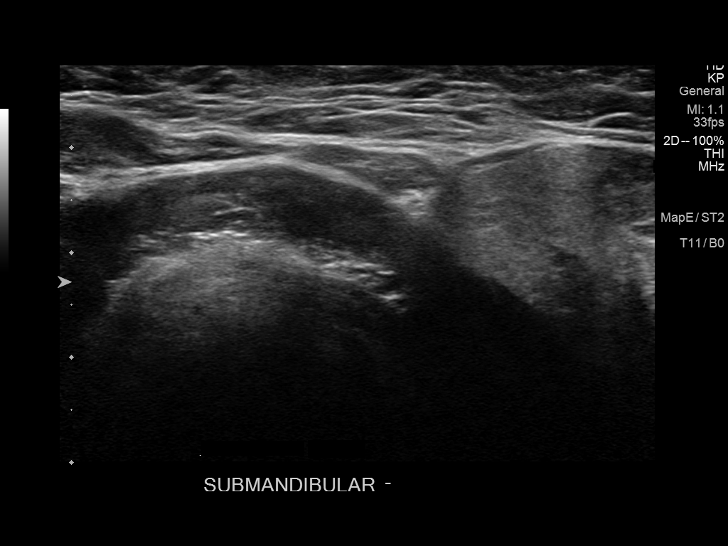

[11 of 11 positions shown; findings below may reference images not displayed]

FINDINGS: Ultrasound performed of the left submandibular area of concern. No
significant soft tissue mass, cyst, fluid collection, hemorrhage,
hematoma, or abscess in the area of concern. There are a few
scattered left submandibular benign-appearing lymph nodes with short
axis measurements of 7 mm. These nodes have preserved fatty hila and
hypoechoic cortex. No bulky adenopathy.
IMPRESSION: No significant left submandibular abnormality in the region of
concern. Benign-appearing small lymph nodes noted.

## 2020-10-12 ENCOUNTER — Ambulatory Visit: Payer: BC Managed Care – PPO | Admitting: Neurology

## 2020-10-12 ENCOUNTER — Other Ambulatory Visit: Payer: Self-pay

## 2020-10-12 ENCOUNTER — Encounter: Payer: Self-pay | Admitting: Neurology

## 2020-10-12 VITALS — BP 122/78 | HR 83 | Ht 64.0 in | Wt 223.0 lb

## 2020-10-12 DIAGNOSIS — R0683 Snoring: Secondary | ICD-10-CM | POA: Diagnosis not present

## 2020-10-12 DIAGNOSIS — G43009 Migraine without aura, not intractable, without status migrainosus: Secondary | ICD-10-CM | POA: Diagnosis not present

## 2020-10-12 DIAGNOSIS — R9389 Abnormal findings on diagnostic imaging of other specified body structures: Secondary | ICD-10-CM

## 2020-10-12 DIAGNOSIS — Z8673 Personal history of transient ischemic attack (TIA), and cerebral infarction without residual deficits: Secondary | ICD-10-CM

## 2020-10-12 DIAGNOSIS — R351 Nocturia: Secondary | ICD-10-CM | POA: Diagnosis not present

## 2020-10-12 DIAGNOSIS — R4789 Other speech disturbances: Secondary | ICD-10-CM

## 2020-10-12 MED ORDER — NURTEC 75 MG PO TBDP: 75.0000 mg | ORAL_TABLET | Freq: Once | ORAL | 3 refills | Status: DC | PRN

## 2020-10-12 NOTE — Patient Instructions (Signed)
It was nice to meet you today.  Your neurological exam is nonfocal, which is reassuring.  Nevertheless, due to the finding on your recent head CT, I would like to proceed with a brain MRI with and without contrast.  Findings of fluid collection in the front part of your head is likely chronic, unclear if this was from birth or from a prior injury.  I do not think it relates to your recent fall.  For migraine management, we can consider a day-to-day preventative in the near future.  For now, for as needed use for acute migraine: We will try you on Nurtec 75 mg strength: Take 1 pill at onset of migraine headache, may repeat in 2 hours, no more than 2 pills in 24 hours. May cause sedation and nausea.   We will also proceed with a sleep study to rule out underlying obstructive sleep apnea.  If you have obstructive sleep apnea, it can contribute to headaches and also daytime tiredness and sleep disruption as well as sleep deprivation.  We may consider treatment with a so-called CPAP or AutoPap machine.

## 2020-10-12 NOTE — Progress Notes (Signed)
Subjective:    Patient ID: Peggy Mahoney is a 45 y.o. female.  HPI     Star Age, MD, PhD [facility] [address].O. [place], [place] [address]  Dear Loma Sousa,   I saw your patient, Peggy Mahoney, upon your kind request, in my neurologic clinic today for initial consultation of her recurrent headaches, with s/p fall, concern for Postconcussive headaches.  The patient is unaccompanied today.  As you know, Peggy Mahoney is a 45 year old right-handed woman with an underlying medical history of anemia, anxiety, reflux disease, hypothyroidism, mitral valve prolapse, MI, stroke, and obesity, who reports recurrent headaches for the past several years.  Her headaches became worse after her fall in January.  Her headaches are typically behind both eyes and sometimes on top of her head.  She does not have a prior history of head injury or intracranial hemorrhage.  She does not report any developmental anomalies or delays.  She lives with her family which includes her husband, 2 daughters, and 41-month-old granddaughter.  Patient works as a [employer].  She had a recent eye examination in January 2022 with [facility].  She has contact lenses.  With her headaches, she has associated light sensitivity and sometimes nausea, sometimes vomiting.  She has a family history of migraines affecting her mom.  Patient lost her mom at the age of 26 about 2 years ago, mom had dementia, migraine headaches, diabetes, neuropathy and lung cancer.  Patient's father is 27 years old and has memory loss.  She reports a history of stroke in 2008.  She had work-up in [place], prior records or prior scans are not available.  She has a 7 to 8-year history of word finding difficulty.  She has a history of colitis and vertigo.  She has occasional visual blurriness with her headache but no classic visual aura such as scotoma.  She recuperated after her stroke.  She had  right-sided weakness at the time of her stroke in 2008.  She does snore.  She has nocturia about once or twice per average night.  She has never had a sleep study.    I reviewed your office note from 07/23/2020.  She reported feeling better.  She had fallen in January, had hit her head against a door frame.  She did not lose consciousness.  She presented to the emergency room on 07/09/2020, I reviewed the ED records from [facility] emergency room from 07/09/2020.  She had some tingling in her right hand at the time of the ER visit.  She landed on her right side and primarily on her right shoulder and upper arm and right rib cage.  She had a head CT and cervical spine CT without contrast on 07/09/2020 and I reviewed the results: IMPRESSION: 1. No acute intracranial abnormality. No scalp swelling or calvarial fracture. 2. Prominence of the extra-axial CSF filled space across the bilateral frontal convexities inter hemispheric fissure may reflect some frontal predominant, age advanced volume loss; hygroma or chronic subdural is less favored. 3. No acute cervical spine fracture or traumatic listhesis. 4. Multilevel degenerative changes of the cervical spine, as above.  She was treated symptomatically with Tylenol and fentanyl.  She was given a prescription for Robaxin. For her headaches she has tried over-the-counter Motrin or Tylenol.  Current headache frequency is once or twice per week. She does not smoke or drink alcohol.  She drinks caffeine in the form of coffee,  1 or 2 cups/day on average.  Her Past Medical History Is Significant For: Past Medical History:  Diagnosis Date  . Anemia   . Anxiety   . Bronchitis    has inhaler prn  . Colitis   . GERD (gastroesophageal reflux disease)   . Hypothyroidism   . MI (myocardial infarction) ([facility]) 11/08/2006  . MVP (mitral valve prolapse)   . Stroke ([facility]) 01/04/2007  . SVD (spontaneous vaginal delivery)    x 3    Her Past Surgical  History Is Significant For: Past Surgical History:  Procedure Laterality Date  . ABDOMINAL HYSTERECTOMY    . DILATION AND CURETTAGE OF UTERUS     x 3 terminations  . VAGINAL HYSTERECTOMY Bilateral 12/13/2017   Procedure: HYSTERECTOMY VAGINAL;  Surgeon: Christophe Louis, MD;  Location: [facility];  Service: Gynecology;  Laterality: Bilateral;  . WISDOM TOOTH EXTRACTION      Her Family History Is Significant For: Family History  Problem Relation Age of Onset  . Cancer Mother   . Diabetes Mother   . Hypertension Mother   . Hyperlipidemia Mother   . Glaucoma Mother   . Cancer Father   . Diabetes Father   . Hyperlipidemia Father   . Hypertension Father   . Heart attack Father   . Thyroid disease Neg Hx     Her Social History Is Significant For: Social History   Socioeconomic History  . Marital status: Married    Spouse name: Not on file  . Number of children: Not on file  . Years of education: Not on file  . Highest education level: Not on file  Occupational History  . Not on file  Tobacco Use  . Smoking status: Never Smoker  . Smokeless tobacco: Never Used  Vaping Use  . Vaping Use: Never used  Substance and Sexual Activity  . Alcohol use: No  . Drug use: No  . Sexual activity: Not on file  Other Topics Concern  . Not on file  Social History Narrative  . Not on file   Social Determinants of Health   Financial Resource Strain: Not on file  Food Insecurity: Not on file  Transportation Needs: Not on file  Physical Activity: Not on file  Stress: Not on file  Social Connections: Not on file    Her Allergies Are:  Allergies  Allergen Reactions  . Naproxen Nausea And Vomiting  . Pineapple Flavor     Blisters   :   Her Current Medications Are:  Outpatient Encounter Medications as of 10/12/2020  Medication Sig  . buPROPion (WELLBUTRIN) 100 MG tablet Take 100 mg by mouth 2 (two) times daily.  . meloxicam (MOBIC) 15 MG tablet Take 1 tablet daily as needed for chest wall  pain.  . methimazole (TAPAZOLE) 5 MG tablet Take 1 tablet (5 mg total) by mouth daily.  . sertraline (ZOLOFT) 50 MG tablet Take 50 mg by mouth daily. Takes 50 mg int he morning and 100 mg at bedtime  . [DISCONTINUED] albuterol (PROVENTIL HFA;VENTOLIN HFA) 108 (90 Base) MCG/ACT inhaler Inhale 1-2 puffs into the lungs every 6 (six) hours as needed for wheezing or shortness of breath.  . [DISCONTINUED] diclofenac Sodium (VOLTAREN) 1 % GEL Apply 4 g topically 4 (four) times daily.  . [DISCONTINUED] docusate sodium (COLACE) 100 MG capsule Take 100 mg by mouth 2 (two) times daily.  . [DISCONTINUED] meclizine (ANTIVERT) 25 MG tablet Take 1 tablet (25 mg total) by mouth 3 (three) times daily as  needed for dizziness.  . [DISCONTINUED] methocarbamol (ROBAXIN) 500 MG tablet Take 1 tablet (500 mg total) by mouth 2 (two) times daily.  . [DISCONTINUED] promethazine (PHENERGAN) 25 MG tablet Take 25 mg by mouth every 8 (eight) hours as needed for nausea or vomiting.  . [DISCONTINUED] ranitidine (ZANTAC) 150 MG capsule Take 150 mg by mouth daily as needed for heartburn.   . [DISCONTINUED] sertraline (ZOLOFT) 100 MG tablet Take 100 mg by mouth at bedtime. Takes 100 mg at bedtime and 50 mg in the morning   No facility-administered encounter medications on file as of 10/12/2020.  :   Review of Systems:  Out of a complete 14 point review of systems, all are reviewed and negative with the exception of these symptoms as listed below: Review of Systems  Neurological:       Here for consult on CT that showed fluid on the brain. Pt reports this was an incidental finding based off a fall she had in January. Pt reports her most concerning sx are h/a, trouble finding words, and muscle twitching.    Objective:  Neurological Exam  Physical Exam Physical Examination:   Vitals:   10/12/20 1415  BP: 122/78  Pulse: 83    General Examination: The patient is a very pleasant 45 y.o. female in no acute distress. She  appears well-developed and well-nourished and well groomed.   HEENT: Normocephalic, atraumatic, pupils are equal, round and reactive to light and accommodation. Funduscopic exam is normal with sharp disc margins noted. Extraocular tracking is good without limitation to gaze excursion or nystagmus noted. Normal smooth pursuit is noted. Hearing is grossly intact. Tympanic membranes are clear bilaterally. Face is symmetric with normal facial animation and normal facial sensation. Speech is clear with no dysarthria noted. There is no hypophonia. There is no lip, neck/head, jaw or voice tremor. Neck is supple with full range of passive and active motion. There are no carotid bruits on auscultation. Oropharynx exam reveals: mild mouth dryness, adequate dental hygiene and mild airway crowding, due to small airway entry, smaller tonsils noted.  Mallampati class I.  Tongue protrudes centrally and palate elevates symmetrically.  Chest: Clear to auscultation without wheezing, rhonchi or crackles noted.  Heart: S1+S2+0, regular and normal without murmurs, rubs or gallops noted.   Abdomen: Soft, non-tender and non-distended with normal bowel sounds appreciated on auscultation.  Extremities: There is no pitting edema in the distal lower extremities bilaterally. Pedal pulses are intact.  Skin: Warm and dry without trophic changes noted.  Musculoskeletal: exam reveals no obvious joint deformities, tenderness or joint swelling or erythema.   Neurologically:  Mental status: The patient is awake, alert and oriented in all 4 spheres. Her immediate and remote memory, attention, language skills and fund of knowledge are appropriate. There is no evidence of aphasia, agnosia, apraxia or anomia. Speech is clear with normal prosody and enunciation. Thought process is linear. Mood is normal and affect is normal.  Cranial nerves II - XII are as described above under HEENT exam. In addition: shoulder shrug is normal with equal  shoulder height noted. Motor exam: Normal bulk, strength and tone is noted. There is no drift, tremor or rebound. Romberg is negative. Reflexes are 2+ throughout. Babinski: Toes are flexor bilaterally. Fine motor skills and coordination: intact with normal finger taps, normal hand movements, normal rapid alternating patting, normal foot taps and normal foot agility.  Cerebellar testing: No dysmetria or intention tremor on finger to nose testing. Heel to shin is unremarkable  bilaterally. There is no truncal or gait ataxia.  Sensory exam: intact to light touch, vibration, temperature sense in the upper and lower extremities.  Gait, station and balance: She stands easily. No veering to one side is noted. No leaning to one side is noted. Posture is age-appropriate and stance is narrow based. Gait shows normal stride length and normal pace. No problems turning are noted. Tandem walk is unremarkable.   Assessment and Plan:   In summary, Peggy Mahoney is a very pleasant 45 y.o.-year old female with an underlying medical history of anemia, anxiety, reflux disease, hypothyroidism, mitral valve prolapse, MI, stroke, and obesity, who presents for evaluation of her recurrent headaches.  History and headache description are in keeping with migraine without aura.  She did report an increase in her headaches after her fall but currently has gone back to having a headache about once or twice per week.  Recent head CT without contrast showed chronic changes including frontal fluid collection, she was advised that these are chronic findings and unrelated to her fall in January.  Neurological exam is nonfocal which is reassuring.  We mutually agreed to pursue additional work-up in the form of brain MRI with and without contrast to evaluate her abnormality found on the recent head CT.  She does not have a known history of developmental anomaly or prior findings similar on an MRI.  She does have a history of stroke in  2008 and would have likely had an MRI at the time but does not have those records.  She reports that your office has tried to get those records from [place].  We will await the status of previous records.  She had a recent eye examination.  We mutually agreed to treat her migraines symptomatically with as needed medication for now, we can certainly consider a preventative in the near future.  Furthermore, she may be at risk for underlying sleep apnea.  She is advised to proceed with a sleep study as untreated sleep apnea may be a contributor to recurrent headaches.  Treating sleep apnea may be of value for that reason.  She is agreeable to pursuing sleep testing.  We will proceed with Nurtec as needed 75 mg strength for acute migraines.  She was given written instructions and a new prescription.  She is advised to follow-up routinely in this clinic in 3 months and we will keep her posted as to her brain MRI results and sleep test results in the interim by phone call.  I answered all her questions today and she was in agreement with the plan.  Thank you very much for allowing me to participate in the care of this nice patient. If I can be of any further assistance to you please do not hesitate to call me at [phone].  Sincerely,   Star Age, MD, PhD

## 2020-10-13 ENCOUNTER — Telehealth: Payer: Self-pay | Admitting: Neurology

## 2020-10-13 NOTE — Telephone Encounter (Signed)
LVM for pt to call back about scheduling mri  [employer] auth: [id number] (exp. 10/13/20 to 04/10/21)

## 2020-10-14 MED ORDER — ALPRAZOLAM 0.5 MG PO TABS: ORAL_TABLET | ORAL | 0 refills | Status: DC

## 2020-10-14 NOTE — Telephone Encounter (Signed)
Patient returned my call she is scheduled at [facility] for 11/03/20.  She also informed me she is extremely claustrophobic and would need the strongest medicine she can get. She is going to have her husband to drive her.

## 2020-10-14 NOTE — Telephone Encounter (Signed)
I have ordered Xanax for patient's upcoming MRI due to anxiety/claustrophobia reported. Please inform patient or caregiver and remind them, that she should not drive after taking Xanax and have someone take him for the MRI appointment.

## 2020-10-14 NOTE — Telephone Encounter (Signed)
x2 lvm for pt to call back about scheduling mri  

## 2020-10-14 NOTE — Telephone Encounter (Addendum)
Pt notified of MD's response and Xanax prescription details. She verbalized understanding and appreciation for the call.

## 2020-10-14 NOTE — Addendum Note (Signed)
Addended by: Star Age on: 10/14/2020 11:24 AM   Modules accepted: Orders

## 2020-10-15 ENCOUNTER — Telehealth: Payer: Self-pay

## 2020-10-15 NOTE — Telephone Encounter (Signed)
PA for Nurtec has been sent.   (Key: [id number])  Your information has been submitted to [place]. To check for an updated outcome later, reopen this PA request from your dashboard.  If [employer] has not responded to your request within 24 hours, contact [place] at [phone]. If you think there may be a problem with your PA request, use our live chat feature at the bottom right.

## 2020-10-19 ENCOUNTER — Telehealth: Payer: Self-pay

## 2020-10-19 NOTE — Telephone Encounter (Signed)
LVM for pt to call me back to schedule sleep study  

## 2020-10-21 NOTE — Telephone Encounter (Signed)
PA for Nurtec has been denied due to the fact that the pt has not had a documented trial/failure to AED (ie topamax), Beta blockers, or antidepressants for at least 8 weeks.   Pt was given co-pay assistance. Will continue to use for now.

## 2020-10-22 NOTE — Telephone Encounter (Signed)
The office will be closed May 17 for voting. I left a voicemail informing the patient to call me back to r/s her MRI because we will be closed that afternoon. I left my direct number to call back to.

## 2020-10-22 NOTE — Telephone Encounter (Signed)
Patient returned my call she is r/s for 11/04/20 at [facility].

## 2020-11-03 ENCOUNTER — Other Ambulatory Visit: Payer: BC Managed Care – PPO

## 2020-11-04 ENCOUNTER — Other Ambulatory Visit: Payer: Self-pay

## 2020-11-04 ENCOUNTER — Ambulatory Visit: Payer: BC Managed Care – PPO

## 2020-11-04 DIAGNOSIS — R0683 Snoring: Secondary | ICD-10-CM

## 2020-11-04 DIAGNOSIS — R4789 Other speech disturbances: Secondary | ICD-10-CM

## 2020-11-04 DIAGNOSIS — R351 Nocturia: Secondary | ICD-10-CM

## 2020-11-04 DIAGNOSIS — R9389 Abnormal findings on diagnostic imaging of other specified body structures: Secondary | ICD-10-CM

## 2020-11-04 DIAGNOSIS — Z8673 Personal history of transient ischemic attack (TIA), and cerebral infarction without residual deficits: Secondary | ICD-10-CM | POA: Diagnosis not present

## 2020-11-04 DIAGNOSIS — G43009 Migraine without aura, not intractable, without status migrainosus: Secondary | ICD-10-CM | POA: Diagnosis not present

## 2020-11-04 MED ORDER — GADOBENATE DIMEGLUMINE 529 MG/ML IV SOLN
15.0000 mL | Freq: Once | INTRAVENOUS | Status: AC | PRN
  Administered 2020-11-04: 15 mL via INTRAVENOUS

## 2020-11-05 ENCOUNTER — Telehealth: Payer: Self-pay

## 2020-11-05 NOTE — Telephone Encounter (Signed)
-----   Message from Star Age, MD sent at 11/05/2020  2:28 PM EDT ----- Please call and advise the patient that her brain MRI with and without contrast from 11/05/2020 was reported as normal.

## 2020-11-05 NOTE — Telephone Encounter (Signed)
Pt called and results of MRI were given. Pt will keep f/u and ss as scheduled.

## 2020-11-05 NOTE — Progress Notes (Signed)
Please call and advise the patient that her brain MRI with and without contrast from 11/05/2020 was reported as normal.

## 2020-11-10 ENCOUNTER — Other Ambulatory Visit: Payer: Self-pay | Admitting: Family Medicine

## 2020-11-10 DIAGNOSIS — Z1231 Encounter for screening mammogram for malignant neoplasm of breast: Secondary | ICD-10-CM

## 2020-11-18 ENCOUNTER — Ambulatory Visit (INDEPENDENT_AMBULATORY_CARE_PROVIDER_SITE_OTHER): Payer: BC Managed Care – PPO | Admitting: Neurology

## 2020-11-18 ENCOUNTER — Other Ambulatory Visit: Payer: Self-pay

## 2020-11-18 DIAGNOSIS — Z8673 Personal history of transient ischemic attack (TIA), and cerebral infarction without residual deficits: Secondary | ICD-10-CM

## 2020-11-18 DIAGNOSIS — R4789 Other speech disturbances: Secondary | ICD-10-CM

## 2020-11-18 DIAGNOSIS — G43009 Migraine without aura, not intractable, without status migrainosus: Secondary | ICD-10-CM

## 2020-11-18 DIAGNOSIS — G4720 Circadian rhythm sleep disorder, unspecified type: Secondary | ICD-10-CM

## 2020-11-18 DIAGNOSIS — G4733 Obstructive sleep apnea (adult) (pediatric): Secondary | ICD-10-CM

## 2020-11-18 DIAGNOSIS — R9389 Abnormal findings on diagnostic imaging of other specified body structures: Secondary | ICD-10-CM

## 2020-11-18 DIAGNOSIS — R0683 Snoring: Secondary | ICD-10-CM

## 2020-11-18 DIAGNOSIS — R351 Nocturia: Secondary | ICD-10-CM

## 2020-11-24 NOTE — Procedures (Signed)
PATIENT'S NAME:  Peggy Mahoney, Peggy Mahoney DOB:      [birth year]      MR#:    [id number]     DATE OF RECORDING: 11/18/2020 REFERRING M.D.:  Marda Stalker, PA Study Performed:   Baseline Polysomnogram HISTORY: 45 year old woman with a history of anemia, anxiety, reflux disease, hypothyroidism, mitral valve prolapse, MI, stroke, and obesity, who reports recurrent headaches for the past several years.  Her headaches became worse after her fall in January. She snores and reports nocturia. The patient's weight 223 pounds with a height of 64 (inches), resulting in a BMI of 38. kg/m2. The patient's neck circumference measured  inches.  CURRENT MEDICATIONS: Wellbutrin, Mobic, Tapazole, Zoloft   PROCEDURE:  This is a multichannel digital polysomnogram utilizing the Somnostar 11.2 system.  Electrodes and sensors were applied and monitored per AASM Specifications.   EEG, EOG, Chin and Limb EMG, were sampled at 200 Hz.  ECG, Snore and Nasal Pressure, Thermal Airflow, Respiratory Effort, CPAP Flow and Pressure, Oximetry was sampled at 50 Hz. Digital video and audio were recorded.      BASELINE STUDY  Lights Out was at 20:47 and Lights On at 05:10.  Total recording time (TRT) was 504 minutes, with a total sleep time (TST) of 472.5 minutes.   The patient's sleep latency was 16 minutes.  REM latency was 164 minutes, which is delayed. The sleep efficiency was 93.8 %.     SLEEP ARCHITECTURE: WASO (Wake after sleep onset) was 23 minutes with minimal to mild sleep fragmentation noted. There were 16.5 minutes in Stage N1, 332 minutes Stage N2, 22.5 minutes Stage N3 and 101.5 minutes in Stage REM.  The percentage of Stage N1 was 3.5%, Stage N2 was 70.3%, which is increased, Stage N3 was 4.8% and Stage R (REM sleep) was 21.5%, which is normal. The arousals were noted as: 121 were spontaneous, 0 were associated with PLMs, 6 were associated with respiratory events.  RESPIRATORY ANALYSIS:  There were a total of 20 respiratory events:   14 obstructive apneas, 0 central apneas and 0 mixed apneas with a total of 14 apneas and an apnea index (AI) of 1.8 /hour. There were 6 hypopneas with a hypopnea index of .8 /hour. The patient also had 0 respiratory event related arousals (RERAs).      The total APNEA/HYPOPNEA INDEX (AHI) was 2.5/hour and the total RESPIRATORY DISTURBANCE INDEX was  2.5 /hour.  16 events occurred in REM sleep and 4 events in NREM. The REM AHI was  9.5 /hour, versus a non-REM AHI of .6. The patient spent 172.5 minutes of total sleep time in the supine position and 300 minutes in non-supine.. The supine AHI was 6.6 versus a non-supine AHI of 0.2.  OXYGEN SATURATION & C02:  The Wake baseline 02 saturation was 95%, with the lowest being 86%. Time spent below 89% saturation equaled 0 minutes.  PERIODIC LIMB MOVEMENTS: The patient had a total of 0 Periodic Limb Movements.  The Periodic Limb Movement (PLM) index was 0 and the PLM Arousal index was 0/hour. Audio and video analysis did not show any abnormal or unusual movements, behaviors, phonations or vocalizations. The patient took no bathroom breaks. Mild to moderate snoring was noted. The EKG was in keeping with normal sinus rhythm (NSR).  Post-study, the patient indicated that sleep was the same as usual.   IMPRESSION:  1. Primary Snoring 2. Dysfunctions associated with sleep stages or arousal from sleep  RECOMMENDATIONS:  1. This study does not demonstrate any significant  obstructive or central sleep disordered breathing with the exception of snoring and mild, supine and REM related sleep apnea. Treatment with positive airway pressure is not necessary. Weight loss and avoiding the supine sleep position may alleviate her supine and REM sleep related sleep apnea.  2. This study shows some sleep fragmentation and abnormal sleep stage percentages; these are nonspecific findings and per se do not signify an intrinsic sleep disorder or a cause for the patient's  sleep-related symptoms. Causes include (but are not limited to) the first night effect of the sleep study, circadian rhythm disturbances, medication effect or an underlying mood disorder or medical problem.  3. The patient should be cautioned not to drive, work at heights, or operate dangerous or heavy equipment when tired or sleepy. Review and reiteration of good sleep hygiene measures should be pursued with any patient. 4. The patient will be seen in follow-up at [facility] for discussion of the test results, symptom and treatment review. The referring provider will be notified of the test results.  I certify that I have reviewed the entire raw data recording prior to the issuance of this report in accordance with the Standards of Accreditation of the American Academy of Sleep Medicine (AASM)   Star Age, MD, PhD Diplomat, American Board of Neurology and Sleep Medicine (Neurology and Sleep Medicine)

## 2020-11-24 NOTE — Progress Notes (Signed)
Patient referred by Marda Stalker for recurrent HAs, seen by me on 10/12/20, diagnostic PSG on 11/18/20.   Please call and notify the patient that the recent sleep study did not show any significant obstructive sleep apnea with the exception of snoring and mild, supine and REM related sleep apnea. Treatment with positive airway pressure is not necessary. Weight loss and avoiding the supine sleep position may alleviate her supine and REM sleep related sleep apnea.  She can keep her followup appointment as scheduled. Thanks,  Star Age, MD, PhD [facility])

## 2020-11-26 ENCOUNTER — Telehealth: Payer: Self-pay

## 2020-11-26 NOTE — Telephone Encounter (Signed)
Pt has called Megan,RN back. Please call.

## 2020-11-26 NOTE — Telephone Encounter (Signed)
I called pt. No answer, left a message asking pt to call me back.   

## 2020-11-26 NOTE — Telephone Encounter (Signed)
-----   Message from Star Age, MD sent at 11/24/2020  5:19 PM EDT ----- Patient referred by Marda Stalker for recurrent HAs, seen by me on 10/12/20, diagnostic PSG on 11/18/20.   Please call and notify the patient that the recent sleep study did not show any significant obstructive sleep apnea with the exception of snoring and mild, supine and REM related sleep apnea. Treatment with positive airway pressure is not necessary. Weight loss and avoiding the supine sleep position may alleviate her supine and REM sleep related sleep apnea.  She can keep her followup appointment as scheduled. Thanks,  Star Age, MD, PhD [facility])

## 2020-11-26 NOTE — Telephone Encounter (Signed)
I called pt back and we discussed results. She verbalized understanding and had no questions/ concerns will f/u as scheduled in August.

## 2021-01-06 ENCOUNTER — Ambulatory Visit
Admission: RE | Admit: 2021-01-06 | Discharge: 2021-01-06 | Disposition: A | Discharge status: Home / Self Care | Payer: BC Managed Care – PPO | Source: Ambulatory Visit | Attending: Family Medicine | Admitting: Family Medicine

## 2021-01-06 ENCOUNTER — Other Ambulatory Visit: Payer: Self-pay

## 2021-01-06 DIAGNOSIS — Z1231 Encounter for screening mammogram for malignant neoplasm of breast: Secondary | ICD-10-CM

## 2021-01-08 ENCOUNTER — Other Ambulatory Visit: Payer: Self-pay | Admitting: Family Medicine

## 2021-01-08 ENCOUNTER — Telehealth: Payer: Self-pay

## 2021-01-08 DIAGNOSIS — R928 Other abnormal and inconclusive findings on diagnostic imaging of breast: Secondary | ICD-10-CM

## 2021-01-08 NOTE — Telephone Encounter (Signed)
PA for Nurtec has been sent via [facility]  ([id number])  Your information has been submitted to [place]. To check for an updated outcome later, reopen this PA request from your dashboard.  If [employer] has not responded to your request within 24 hours, contact [place] at [phone]. If you think there may be a problem with your PA request, use our live chat feature at the bottom right.

## 2021-01-11 NOTE — Telephone Encounter (Signed)
Received fax from [place]. Nurtec approved from 01/08/21-01/08/22. Letter faxed to pharmacy. Received a receipt of confirmation.

## 2021-01-18 ENCOUNTER — Ambulatory Visit: Payer: BC Managed Care – PPO | Admitting: Neurology

## 2021-01-23 ENCOUNTER — Ambulatory Visit
Admission: RE | Admit: 2021-01-23 | Discharge: 2021-01-23 | Disposition: A | Discharge status: Home / Self Care | Payer: BC Managed Care – PPO | Source: Ambulatory Visit | Attending: Family Medicine | Admitting: Family Medicine

## 2021-01-23 ENCOUNTER — Other Ambulatory Visit: Payer: Self-pay

## 2021-01-23 DIAGNOSIS — R928 Other abnormal and inconclusive findings on diagnostic imaging of breast: Secondary | ICD-10-CM

## 2021-01-26 ENCOUNTER — Other Ambulatory Visit: Payer: BC Managed Care – PPO

## 2021-03-15 ENCOUNTER — Ambulatory Visit: Payer: BC Managed Care – PPO | Admitting: Neurology

## 2021-03-16 ENCOUNTER — Other Ambulatory Visit: Payer: Self-pay | Admitting: Family Medicine

## 2021-03-16 DIAGNOSIS — N6452 Nipple discharge: Secondary | ICD-10-CM

## 2021-03-22 ENCOUNTER — Other Ambulatory Visit: Payer: Self-pay

## 2021-03-22 ENCOUNTER — Ambulatory Visit
Admission: RE | Admit: 2021-03-22 | Discharge: 2021-03-22 | Disposition: A | Discharge status: Home / Self Care | Payer: BC Managed Care – PPO | Source: Ambulatory Visit | Attending: Family Medicine | Admitting: Family Medicine

## 2021-03-22 DIAGNOSIS — N6452 Nipple discharge: Secondary | ICD-10-CM

## 2021-04-05 ENCOUNTER — Other Ambulatory Visit: Payer: Self-pay | Admitting: Family Medicine

## 2021-04-05 DIAGNOSIS — N6452 Nipple discharge: Secondary | ICD-10-CM

## 2021-04-06 ENCOUNTER — Ambulatory Visit: Payer: BC Managed Care – PPO | Admitting: Neurology

## 2021-04-06 ENCOUNTER — Other Ambulatory Visit: Payer: Self-pay

## 2021-04-06 ENCOUNTER — Encounter: Payer: Self-pay | Admitting: Neurology

## 2021-04-06 DIAGNOSIS — G43009 Migraine without aura, not intractable, without status migrainosus: Secondary | ICD-10-CM

## 2021-04-06 MED ORDER — NURTEC 75 MG PO TBDP: 75.0000 mg | ORAL_TABLET | Freq: Once | ORAL | 5 refills | Status: DC | PRN

## 2021-04-06 NOTE — Patient Instructions (Addendum)
It was nice to see you again today.  I am pleased to hear that your migraines have responded favorably to the Nurtec.  I have renewed your prescription.  As discussed, we can consider a migraine preventative for daily prevention in the future if the need arises.  Your exam is stable, brain MRI in May was benign and so was your sleep study in June.  Please follow-up routinely to see one of our nurse practitioners in about 6 months, we can also offer you a virtual video visit through MyChart if the need arises.  Please remember, common headache triggers are: sleep deprivation, dehydration, overheating, stress, hypoglycemia or skipping meals and blood sugar fluctuations, excessive pain medications or excessive alcohol use or caffeine withdrawal. Some people have food triggers such as aged cheese, orange juice or chocolate, especially dark chocolate, or MSG (monosodium glutamate). Try to avoid these headache triggers as much possible. It may be helpful to keep a headache diary to figure out what makes your headaches worse or brings them on and what alleviates them. Some people report headache onset after exercise but studies have shown that regular exercise may actually prevent headaches from coming. If you have exercise-induced headaches, please make sure that you drink plenty of fluid before and after exercising and that you do not over do it and do not overheat.

## 2021-04-06 NOTE — Progress Notes (Signed)
Subjective:    Patient ID: Peggy Mahoney is a 45 y.o. female.  HPI    Interim history:   Peggy Mahoney is a 45 year old right-handed woman with an underlying medical history of anemia, anxiety, reflux disease, hypothyroidism, mitral valve prolapse, MI, stroke, and obesity, who presents for follow-up consultation of her recurrent headaches.  The patient is unaccompanied today.  I first met her at the request of her primary care PA on 10/12/2020, at which time she reported a several year history of recurrent migrainous headaches.  She felt that her headaches became worse after she had a fall in January.  Her history was in keeping with migraine headaches.  She was advised to start Nurtec as needed we talked about utilizing a preventative medication in the near future.  She was advised to proceed with a sleep study as she reported snoring and nocturia.  She was also advised to proceed with a brain MRI.  She had a brain MRI with and without contrast on 11/04/2020 and I reviewed the results: Impression: Unremarkable MRI brain with and without contrast.  No acute findings. She was notified of the test results.  She had a baseline sleep study on 11/18/2020.  Sleep efficiency was 93.8%, sleep latency 16 minutes, REM latency 164 minutes.  She had an increased percentage of stage II sleep.  She had a normal percentage of REM sleep.  Overall AHI was 2.5/h.  Average oxygen saturation 95%, nadir was 86% briefly.  She had no significant PLM's.  Today, 04/06/2021: She reports doing well.  Her headaches are better.  So long as she takes the Nurtec soon enough after her headaches start, it works really well.  She feels like she is down to just a few headaches per month and they are tolerable, not currently in favor of adding a preventative.  About a month ago, she started having neck pain and saw her primary care.  She is now on Flexeril and meloxicam.  She also received an injection into the left shoulder.  She is  trying to exercise, is trying to stay well-hydrated with water, drinks caffeine in the form of coffee, about 2 cups/day and is working on weight loss.  She does report stress as a trigger.  Nevertheless, overall, she is feeling better.  She has no side effects from the Nurtec.  She is rather pleased with how it is working for her.  The patient's allergies, current medications, family history, past medical history, past social history, past surgical history and problem list were reviewed and updated as appropriate.   Previously:   10/12/20: (She) reports recurrent headaches for the past several years.  Her headaches became worse after her fall in January.  Her headaches are typically behind both eyes and sometimes on top of her head.  She does not have a prior history of head injury or intracranial hemorrhage.  She does not report any developmental anomalies or delays.  She lives with her family which includes her husband, 2 daughters, and 75-month-old granddaughter.  Patient works as a [employer].  She had a recent eye examination in January 2022 with [facility].  She has contact lenses.  With her headaches, she has associated light sensitivity and sometimes nausea, sometimes vomiting.  She has a family history of migraines affecting her mom.  Patient lost her mom at the age of 77 about 2 years ago, mom had dementia, migraine headaches, diabetes, neuropathy and lung cancer.  Patient's father is 47  years old and has memory loss.  She reports a history of stroke in 2008.  She had work-up in [place], prior records or prior scans are not available.  She has a 7 to 8-year history of word finding difficulty.  She has a history of colitis and vertigo.  She has occasional visual blurriness with her headache but no classic visual aura such as scotoma.  She recuperated after her stroke.  She had right-sided weakness at the time of her stroke in 2008.  She does snore.  She has nocturia about once or twice per  average night.  She has never had a sleep study.     I reviewed your office note from 07/23/2020.  She reported feeling better.  She had fallen in January, had hit her head against a door frame.  She did not lose consciousness.  She presented to the emergency room on 07/09/2020, I reviewed the ED records from [place] emergency room from 07/09/2020.  She had some tingling in her right hand at the time of the ER visit.  She landed on her right side and primarily on her right shoulder and upper arm and right rib cage.  She had a head CT and cervical spine CT without contrast on 07/09/2020 and I reviewed the results: IMPRESSION: 1. No acute intracranial abnormality. No scalp swelling or calvarial fracture. 2. Prominence of the extra-axial CSF filled space across the bilateral frontal convexities inter hemispheric fissure may reflect some frontal predominant, age advanced volume loss; hygroma or chronic subdural is less favored. 3. No acute cervical spine fracture or traumatic listhesis. 4. Multilevel degenerative changes of the cervical spine, as above.   She was treated symptomatically with Tylenol and fentanyl.  She was given a prescription for Robaxin. For her headaches she has tried over-the-counter Motrin or Tylenol.  Current headache frequency is once or twice per week. She does not smoke or drink alcohol.  She drinks caffeine in the form of coffee, 1 or 2 cups/day on average.  Her Past Medical History Is Significant For: Past Medical History:  Diagnosis Date   Anemia    Anxiety    Bronchitis    has inhaler prn   Colitis    GERD (gastroesophageal reflux disease)    Hypothyroidism    MI (myocardial infarction) ([place]) 11/08/2006   MVP (mitral valve prolapse)    Pinched nerve in neck    Rotator cuff disorder, left    Stroke ([place]) 01/04/2007   SVD (spontaneous vaginal delivery)    x 3    Her Past Surgical History Is Significant For: Past Surgical History:  Procedure  Laterality Date   ABDOMINAL HYSTERECTOMY     DILATION AND CURETTAGE OF UTERUS     x 3 terminations   VAGINAL HYSTERECTOMY Bilateral 12/13/2017   Procedure: HYSTERECTOMY VAGINAL;  Surgeon: Christophe Louis, MD;  Location: [facility];  Service: Gynecology;  Laterality: Bilateral;   WISDOM TOOTH EXTRACTION      Her Family History Is Significant For: Family History  Problem Relation Age of Onset   Cancer Mother    Diabetes Mother    Hypertension Mother    Hyperlipidemia Mother    Glaucoma Mother    Cancer Father    Diabetes Father    Hyperlipidemia Father    Hypertension Father    Heart attack Father    Thyroid disease Neg Hx    Migraines Neg Hx     Her Social History Is Significant For: Social History  Socioeconomic History   Marital status: Married    Spouse name: Not on file   Number of children: Not on file   Years of education: Not on file   Highest education level: Not on file  Occupational History   Not on file  Tobacco Use   Smoking status: Never   Smokeless tobacco: Never  Vaping Use   Vaping Use: Never used  Substance and Sexual Activity   Alcohol use: No   Drug use: No   Sexual activity: Not on file  Other Topics Concern   Not on file  Social History Narrative   Not on file   Social Determinants of Health   Financial Resource Strain: Not on file  Food Insecurity: Not on file  Transportation Needs: Not on file  Physical Activity: Not on file  Stress: Not on file  Social Connections: Not on file    Her Allergies Are:  Allergies  Allergen Reactions   Naproxen Nausea And Vomiting   Pineapple Flavor     Blisters   :   Her Current Medications Are:  Outpatient Encounter Medications as of 04/06/2021  Medication Sig   ALPRAZolam (XANAX) 0.5 MG tablet Take 1-2 pills as needed on call to MRI, may take a third pill if needed.   buPROPion (WELLBUTRIN) 100 MG tablet Take 100 mg by mouth 2 (two) times daily.   cyclobenzaprine (FLEXERIL) 5 MG tablet Take 5 mg by  mouth at bedtime as needed.   meloxicam (MOBIC) 15 MG tablet Take 1 tablet daily as needed for chest wall pain.   methimazole (TAPAZOLE) 5 MG tablet Take 1 tablet (5 mg total) by mouth daily.   sertraline (ZOLOFT) 50 MG tablet Take 50 mg by mouth daily. Takes 50 mg int he morning and 100 mg at bedtime   [DISCONTINUED] Rimegepant Sulfate (NURTEC) 75 MG TBDP Take 75 mg by mouth once as needed for up to 1 dose. May repeat once after 2 hours   Rimegepant Sulfate (NURTEC) 75 MG TBDP Take 75 mg by mouth once as needed for up to 1 dose. May repeat once after 2 hours   [DISCONTINUED] promethazine (PHENERGAN) 25 MG tablet Take 25 mg by mouth every 8 (eight) hours as needed for nausea or vomiting.   No facility-administered encounter medications on file as of 04/06/2021.  :  Review of Systems:  Out of a complete 14 point review of systems, all are reviewed and negative with the exception of these symptoms as listed below:    Review of Systems  Neurological:        Pt is here for follow for migraines and wanted to go over her results of her sleep study.  Pt states she is down to 7 migraines a month now . Pt states she loves nurtec and its working well .   Objective:  Neurological Exam  Physical Exam Physical Examination:   Vitals:   04/06/21 0940  BP: 118/71  Pulse: 86    General Examination: The patient is a very pleasant 45 y.o. female in no acute distress. She appears well-developed and well-nourished and well groomed.   HEENT: Normocephalic, atraumatic, pupils are equal, round and reactive to light, extraocular tracking is good without limitation to gaze excursion or nystagmus noted. Normal smooth pursuit is noted. Hearing is grossly intact. Face is symmetric with normal facial animation and normal facial sensation. Speech is clear with no dysarthria noted. There is no hypophonia. There is no lip, neck/head, jaw or voice tremor.  Neck is supple with full range of passive and active motion.  There are no carotid bruits on auscultation. Oropharynx exam reveals: mild mouth dryness, adequate dental hygiene and mild airway crowding.  Tongue protrudes centrally and palate elevates symmetrically.   Chest: Clear to auscultation without wheezing, rhonchi or crackles noted.   Heart: S1+S2+0, regular and normal without murmurs, rubs or gallops noted.    Abdomen: Soft, non-tender and non-distended.   Extremities: There is no pitting edema.   Skin: Warm and dry without trophic changes noted.   Musculoskeletal: exam reveals no obvious joint deformities, left shoulder slightly higher than right.    Neurologically:  Mental status: The patient is awake, alert and oriented in all 4 spheres. Her immediate and remote memory, attention, language skills and fund of knowledge are appropriate. There is no evidence of aphasia, agnosia, apraxia or anomia. Speech is clear with normal prosody and enunciation. Thought process is linear. Mood is normal and affect is normal.  Cranial nerves II - XII are as described above under HEENT exam.  Motor exam: Normal bulk, strength and tone is noted. There is no drift, tremor or rebound. Romberg is negative. Reflexes are 1-2+ throughout. Babinski: Toes are flexor bilaterally. Fine motor skills and coordination: intact with grossly.   Cerebellar testing: No dysmetria or intention tremor. There is no truncal or gait ataxia.  Sensory exam: intact to light touch in the upper and lower extremities.  Gait, station and balance: She stands easily. No veering to one side is noted. No leaning to one side is noted. Posture is age-appropriate and stance is narrow based. Gait shows normal stride length and normal pace. No problems turning are noted. Tandem walk is unremarkable.    Assessment and Plan:    In summary, Peggy Mahoney is a very pleasant 45 year old female with an underlying medical history of anemia, anxiety, reflux disease, hypothyroidism, mitral valve  prolapse, MI, stroke, and obesity, who presents for follow-up consultation of her migraine headaches.  She had a brain MRI in May 2022 which was benign.  Exam is nonfocal.  She has had good results with Nurtec as needed.  She had no side effects and is currently not in favor of starting a preventative medication, she feels that her headaches have improved.  She is working on lifestyle modifications still.  She is particularly working on weight loss.  Stress can continue to be a trigger.  We talked about headache triggers in general again today.  She had a sleep study in June 2022 which was benign and we talked about her results today.  She is advised to continue with as needed Nurtec.  I renewed the prescription.  She is advised to follow-up in this clinic to see one of our nurse practitioners routinely in 6 months, sooner if needed.  We can also offer her a MyChart video visit if the need arises.  I answered all her questions today and she was in agreement with the plan.   I spent 30 minutes in total face-to-face time and in reviewing records during pre-charting, more than 50% of which was spent in counseling and coordination of care, reviewing test results, reviewing medications and treatment regimen and/or in discussing or reviewing the diagnosis of migraines, the prognosis and treatment options. Pertinent laboratory and imaging test results that were available during this visit with the patient were reviewed by me and considered in my medical decision making (see chart for details).

## 2021-04-08 ENCOUNTER — Other Ambulatory Visit: Payer: Self-pay | Admitting: Family Medicine

## 2021-04-08 DIAGNOSIS — N6452 Nipple discharge: Secondary | ICD-10-CM

## 2021-04-14 ENCOUNTER — Ambulatory Visit
Admission: RE | Admit: 2021-04-14 | Discharge: 2021-04-14 | Disposition: A | Discharge status: Home / Self Care | Payer: BC Managed Care – PPO | Source: Ambulatory Visit | Attending: Family Medicine | Admitting: Family Medicine

## 2021-04-14 ENCOUNTER — Other Ambulatory Visit: Payer: Self-pay

## 2021-04-14 DIAGNOSIS — N6452 Nipple discharge: Secondary | ICD-10-CM

## 2021-04-14 MED ORDER — GADOBUTROL 1 MMOL/ML IV SOLN
10.0000 mL | Freq: Once | INTRAVENOUS | Status: AC | PRN
  Administered 2021-04-14: 10 mL via INTRAVENOUS

## 2021-04-15 ENCOUNTER — Other Ambulatory Visit: Payer: BC Managed Care – PPO

## 2021-06-01 ENCOUNTER — Ambulatory Visit: Payer: BC Managed Care – PPO | Admitting: Neurology

## 2021-07-07 ENCOUNTER — Emergency Department (HOSPITAL_BASED_OUTPATIENT_CLINIC_OR_DEPARTMENT_OTHER): Payer: BC Managed Care – PPO

## 2021-07-07 ENCOUNTER — Encounter (HOSPITAL_BASED_OUTPATIENT_CLINIC_OR_DEPARTMENT_OTHER): Payer: Self-pay

## 2021-07-07 ENCOUNTER — Other Ambulatory Visit: Payer: Self-pay

## 2021-07-07 ENCOUNTER — Emergency Department (HOSPITAL_BASED_OUTPATIENT_CLINIC_OR_DEPARTMENT_OTHER)
Admission: EM | Admit: 2021-07-07 | Discharge: 2021-07-07 | Disposition: A | Discharge status: Home / Self Care | Payer: BC Managed Care – PPO | Attending: Emergency Medicine | Admitting: Emergency Medicine

## 2021-07-07 DIAGNOSIS — X509XXA Other and unspecified overexertion or strenuous movements or postures, initial encounter: Secondary | ICD-10-CM | POA: Diagnosis not present

## 2021-07-07 DIAGNOSIS — E039 Hypothyroidism, unspecified: Secondary | ICD-10-CM | POA: Insufficient documentation

## 2021-07-07 DIAGNOSIS — M25511 Pain in right shoulder: Secondary | ICD-10-CM | POA: Insufficient documentation

## 2021-07-07 MED ORDER — DICLOFENAC SODIUM 1 % EX GEL: 2.0000 g | Freq: Four times a day (QID) | CUTANEOUS | 0 refills | Status: DC | PRN

## 2021-07-07 MED ORDER — OXYCODONE-ACETAMINOPHEN 5-325 MG PO TABS: 1.0000 | ORAL_TABLET | Freq: Four times a day (QID) | ORAL | 0 refills | Status: DC | PRN

## 2021-07-07 MED ORDER — KETOROLAC TROMETHAMINE 30 MG/ML IJ SOLN
30.0000 mg | Freq: Once | INTRAMUSCULAR | Status: AC
  Administered 2021-07-07: 30 mg via INTRAMUSCULAR
  Filled 2021-07-07: qty 1

## 2021-07-07 MED ORDER — SENNOSIDES-DOCUSATE SODIUM 8.6-50 MG PO TABS
1.0000 | ORAL_TABLET | Freq: Every evening | ORAL | 0 refills | Status: DC | PRN
Start: 2021-07-07 — End: 2023-01-01
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
## 2021-07-07 NOTE — ED Triage Notes (Signed)
Pt arrives ambulatory to ED with pain to right shoulder was told that she has a rotator cuff tare. 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 Pain has increased in shoulder and patient reports swelling to her right hand.

## 2021-07-07 NOTE — Discharge Instructions (Signed)

## 2021-09-29 ENCOUNTER — Ambulatory Visit: Payer: BC Managed Care – PPO | Admitting: Family Medicine

## 2021-10-06 ENCOUNTER — Telehealth: Payer: Self-pay | Admitting: *Deleted

## 2021-10-06 NOTE — Telephone Encounter (Addendum)
Pt walked in today with what seems to be a referral form and told our front desk staff that she is having surgery today 10:30 and she needs to be cleared. The front desk called me and relayed the information to me. I stated she will need to leave the form as there is a pre op protocol that the office follows. We will need to obtain further information in order to be able to assist the pt better. I was given the referral form from the front desk that the pt left. I called Marda Stalker, [facility] [place] [facility] [phone] and s/w Wells Guiles.   ? ?I explained to Wells Guiles that we first need a clearance request to be faxed to our office for the procedure. Next we will need to scheduled a NEW PT appt as we have not seen the pt in 4 yrs. I informed Wells Guiles that pt said he surgery is today, though we are not going to be able to clear the pt until she has been seen by cardiology. I also did inform Wells Guiles that the cardiologist may order cardiac testing as well before providing clearance. I asked who was the surgeon or the surgeon's office. Wells Guiles, said she did not know. I asked if she will please find out who the surgeon office is and inform that we will need a clearance request faxed to our office attn pre op team fax [phone]. I stated we will need to know what the procedure is, any blood thinners need to be held, what type of anesthesia to be used, name of surgeon, ph and fax # as well.  ? ?I did tell Wells Guiles that the pt is upset with [place] though we have only been given this information today. Per our front desk staff the pt said surgeon office knew about this 4 months ago.  ? ?At this point our hands are tied until we have further information and clearance request and scheduled a NEW PT APPT.  ? ?

## 2021-10-06 NOTE — Telephone Encounter (Signed)
Pt calling to speak with Nurse. Please advise ?

## 2021-10-07 NOTE — Telephone Encounter (Signed)
Pt has new pt appt with DR. Branch 10/14/21. I will froward notes to MD for upcoming appt . We still do not have a clearance request from the surgeon office. I called the PCP office back to see if they had the name of the surgeon office yet. I was told they did not have the name but they did find out the surgeon office is [facility]. I stated I will see if I can get the surgeon office on the phone to get the information needed. PCP office said they placed a call to the surgeon office but have not heard back. I asked if I am able to reach surgeon office would they like for me to call their office back with the info; reply was yes please that would be great. Both PCP and cardiology have gone through extensive lengths to help put things in order for the pt to have her surgery.  ? ? ?I have s/w [facility] and found out the surgeon is Dr. Veverly Fells. I have requested that a clearance request be faxed over to our office ASAP so that I may have complete information ready for Dr. Harl Bowie appt 10/14/21. I have given fax # [phone] as well as can call my desk [phone].  ? ?I will update PCP office as well.  ? ? ?

## 2021-10-08 ENCOUNTER — Telehealth: Payer: Self-pay | Admitting: *Deleted

## 2021-10-08 NOTE — Telephone Encounter (Signed)
? ?  Pre-operative Risk Assessment  ?  ?Patient Name: Peggy Mahoney  ?DOB: 1976-01-29 ?MRN: [id number]  ? ?  ? ?Request for Surgical Clearance   ? ?Procedure:   RIGHT ROTATOR CUFF REPAIR ? ?Date of Surgery:  Clearance 11/07/21                              ?   ?Surgeon:  DR. Esmond Plants ?Surgeon's Group or Practice Name:  [facility] ?Phone number:  [phone] ?Fax number:  [phone] ATTN: ASHLEY HILTON ?  ?Type of Clearance Requested:   ?- Medical  ?  ?Type of Anesthesia:  General WITH INTERSCALENE BLOCK ?  ?Additional requests/questions:   ? ?Signed, ?Julaine Hua   ?10/08/2021, 12:32 PM  ? ?

## 2021-10-08 NOTE — Telephone Encounter (Signed)
SEE CLEARANCE REQUEST ENTERED TODAY 10/08/21 @ 12:35 PM.  PT HAS NEW PT APPT 10/14/21 WITH DR. MARY BRANCH ?

## 2021-10-14 ENCOUNTER — Ambulatory Visit: Payer: BC Managed Care – PPO | Admitting: Internal Medicine

## 2021-10-14 ENCOUNTER — Encounter: Payer: Self-pay | Admitting: Internal Medicine

## 2021-10-14 VITALS — BP 109/75 | HR 71 | Ht 64.0 in | Wt 222.0 lb

## 2021-10-14 DIAGNOSIS — Z8679 Personal history of other diseases of the circulatory system: Secondary | ICD-10-CM

## 2021-10-14 NOTE — Progress Notes (Signed)
?Cardiology Office Note:   ? ?Date:  10/14/2021  ? ?ID:  Peggy Mahoney, DOB 05/16/1976, MRN [id number] ? ?PCP:  Marda Stalker, PA-C ?  ?[facility] Providers ?Cardiologist:  None    ? ?Referring MD: Marda Stalker, PA-C  ? ?No chief complaint on file. ?Pre-OP for rotator cuff repair ? ?History of Present Illness:   ? ?Peggy Mahoney is a 46 y.o. female with a hx of anemia, anxiety, DM2, ?MVP, ? MI/CVA 2008 referral for pre-op ? ?She said she had an MI in [place] this was in 2008; [facility]. No stent. She notes having a stroke and not remembering all the events. She was on aspirin prior. She was told she had mitral valve prolapse from a cardiologist in [place]. Stated he was on [address] in 2019. She went to [facility] and they were unable to obtain records and she was referred here for pre-op ? ?She denies exertional chest pain or SOB.  ? ? ?Past Medical History:  ?Diagnosis Date  ? Anemia   ? Anxiety   ? Bronchitis   ? has inhaler prn  ? Colitis   ? GERD (gastroesophageal reflux disease)   ? Hypothyroidism   ? MI (myocardial infarction) ([place]) 11/08/2006  ? MVP (mitral valve prolapse)   ? Pinched nerve in neck   ? Rotator cuff disorder, left   ? Stroke ([place]) 01/04/2007  ? SVD (spontaneous vaginal delivery)   ? x 3  ? ? ?Past Surgical History:  ?Procedure Laterality Date  ? ABDOMINAL HYSTERECTOMY    ? DILATION AND CURETTAGE OF UTERUS    ? x 3 terminations  ? VAGINAL HYSTERECTOMY Bilateral 12/13/2017  ? Procedure: HYSTERECTOMY VAGINAL;  Surgeon: Christophe Louis, MD;  Location: [facility];  Service: Gynecology;  Laterality: Bilateral;  ? WISDOM TOOTH EXTRACTION    ? ? ?Current Medications: ?Current Meds  ?Medication Sig  ? buPROPion (WELLBUTRIN) 100 MG tablet Take 100 mg by mouth 2 (two) times daily.  ? methimazole (TAPAZOLE) 5 MG tablet Take 1 tablet (5 mg total) by mouth daily.  ? Rimegepant Sulfate (NURTEC) 75 MG TBDP Take 75 mg by mouth once as needed for up to 1 dose. May  repeat once after 2 hours  ? sertraline (ZOLOFT) 50 MG tablet Take 50 mg by mouth daily. Takes 50 mg int he morning and 100 mg at bedtime  ?  ? ?Allergies:   Naproxen, Pineapple, Diclofenac sodium, Pineapple flavor, Prednisone, and Oxycodone-acetaminophen  ? ?Social History  ? ?Socioeconomic History  ? Marital status: Married  ?  Spouse name: Not on file  ? Number of children: Not on file  ? Years of education: Not on file  ? Highest education level: Not on file  ?Occupational History  ? Not on file  ?Tobacco Use  ? Smoking status: Never  ? Smokeless tobacco: Never  ?Vaping Use  ? Vaping Use: Never used  ?Substance and Sexual Activity  ? Alcohol use: No  ? Drug use: No  ? Sexual activity: Not on file  ?Other Topics Concern  ? Not on file  ?Social History Narrative  ? Not on file  ? ?Social Determinants of Health  ? ?Financial Resource Strain: Not on file  ?Food Insecurity: Not on file  ?Transportation Needs: Not on file  ?Physical Activity: Not on file  ?Stress: Not on file  ?Social Connections: Not on file  ?  ? ?Family History: ?The patient's family history includes Cancer in her father and mother;  Diabetes in her father and mother; Glaucoma in her mother; Heart attack in her father; Hyperlipidemia in her father and mother; Hypertension in her father and mother. There is no history of Thyroid disease or Migraines. No premature CAD ? ?ROS:   ?Please see the history of present illness.    ? All other systems reviewed and are negative. ? ?EKGs/Labs/Other Studies Reviewed:   ? ?The following studies were reviewed today: ? ? ?EKG:  EKG is  ordered today.  The ekg ordered today demonstrates  ? ?EKG 10/14/2021- NSR  ? ?Recent Labs: ?No results found for requested labs within last 8760 hours.  ?Recent Lipid Panel ?No results found for: CHOL, TRIG, HDL, CHOLHDL, VLDL, LDLCALC, LDLDIRECT ? ? ?Risk Assessment/Calculations:   ?  ? ?    ? ?Physical Exam:   ? ?VS:  ? ?Vitals:  ? 10/14/21 1536  ?BP: 109/75  ?Pulse: 71  ?SpO2: 99%   ? ? ? ?Wt Readings from Last 3 Encounters:  ?10/14/21 222 lb (100.7 kg)  ?07/07/21 216 lb (98 kg)  ?04/06/21 218 lb (98.9 kg)  ?  ? ?GEN:  Well nourished, well developed in no acute distress ?HEENT: Normal ?NECK: No JVD; No carotid bruits ?LYMPHATICS: No lymphadenopathy ?CARDIAC: RRR, no murmurs, rubs, gallops ?RESPIRATORY:  Clear to auscultation without rales, wheezing or rhonchi  ?ABDOMEN: Soft, non-tender, non-distended ?MUSCULOSKELETAL:  right arm in a sling ?SKIN: Warm and dry ?NEUROLOGIC:  Alert and oriented x 3 ?PSYCHIATRIC:  Normal affect  ? ?ASSESSMENT:   ? ?Pre-OP. No hx of stents or evidence of infarct on her EKG. No data of her prior ?stroke. She's not on any medications for it. She can do >4 METS and otherwise low risk. She is acceptable cardiac risk for rotator cuff repair ? ?MVP: no murmurs. Will get an echo ?PLAN:   ? ?In order of problems listed above: ? ?TTE ?Follow up in 3 months ?She is acceptable cardiac risk for rotator cuff repair; can proceed prior to the echo ? ?   ? ?   ?Medication Adjustments/Labs and Tests Ordered: ?Current medicines are reviewed at length with the patient today.  Concerns regarding medicines are outlined above.  ?Orders Placed This Encounter  ?Procedures  ? EKG 12-Lead  ? ECHOCARDIOGRAM COMPLETE  ? ?No orders of the defined types were placed in this encounter. ? ? ?Patient Instructions  ?Medication Instructions:  ?Your Physician recommend you continue on your current medication as directed.   ? ?*If you need a refill on your cardiac medications before your next appointment, please call your pharmacy* ? ? ?Testing/Procedures: ?Your physician has requested that you have an echocardiogram. Echocardiography is a painless test that uses sound waves to create images of your heart. It provides your doctor with information about the size and shape of your heart and how well your heart?s chambers and valves are working. This procedure takes approximately one hour. There are no  restrictions for this procedure. ?[place] ? ? ?Follow-Up: ?At [facility], you and your health needs are our priority.  As part of our continuing mission to provide you with exceptional heart care, we have created designated Provider Care Teams.  These Care Teams include your primary Cardiologist (physician) and Advanced Practice Providers (APPs -  Physician Assistants and Nurse Practitioners) who all work together to provide you with the care you need, when you need it. ? ?We recommend signing up for the patient portal called "MyChart".  Sign up information is  provided on this After Visit Summary.  MyChart is used to connect with patients for Virtual Visits (Telemedicine).  Patients are able to view lab/test results, encounter notes, upcoming appointments, etc.  Non-urgent messages can be sent to your provider as well.   ?To learn more about what you can do with MyChart, go to [email or web address].   ? ?Your next appointment:   ?Wednesday January 26, 2022 at 4 pm ? ?The format for your next appointment:   ?In Person ? ?Provider:   ?Dr. Phineas Inches, MD ? ? ?  ? ?Signed, ?Janina Mayo, MD  ?10/14/2021 4:36 PM    ?[place] ?

## 2021-10-14 NOTE — Patient Instructions (Addendum)
Medication Instructions:  ?Your Physician recommend you continue on your current medication as directed.   ? ?*If you need a refill on your cardiac medications before your next appointment, please call your pharmacy* ? ? ?Testing/Procedures: ?Your physician has requested that you have an echocardiogram. Echocardiography is a painless test that uses sound waves to create images of your heart. It provides your doctor with information about the size and shape of your heart and how well your heart?s chambers and valves are working. This procedure takes approximately one hour. There are no restrictions for this procedure. ?[place] ? ? ?Follow-Up: ?At [facility], you and your health needs are our priority.  As part of our continuing mission to provide you with exceptional heart care, we have created designated Provider Care Teams.  These Care Teams include your primary Cardiologist (physician) and Advanced Practice Providers (APPs -  Physician Assistants and Nurse Practitioners) who all work together to provide you with the care you need, when you need it. ? ?We recommend signing up for the patient portal called "MyChart".  Sign up information is provided on this After Visit Summary.  MyChart is used to connect with patients for Virtual Visits (Telemedicine).  Patients are able to view lab/test results, encounter notes, upcoming appointments, etc.  Non-urgent messages can be sent to your provider as well.   ?To learn more about what you can do with MyChart, go to [email or web address].   ? ?Your next appointment:   ?Wednesday January 26, 2022 at 4 pm ? ?The format for your next appointment:   ?In Person ? ?Provider:   ?Dr. Phineas Inches, MD ? ? ? ?

## 2021-10-21 ENCOUNTER — Ambulatory Visit (INDEPENDENT_AMBULATORY_CARE_PROVIDER_SITE_OTHER): Payer: BC Managed Care – PPO

## 2021-10-21 DIAGNOSIS — Z8679 Personal history of other diseases of the circulatory system: Secondary | ICD-10-CM | POA: Diagnosis not present

## 2021-10-21 LAB — ECHOCARDIOGRAM COMPLETE
AR max vel: 2.24 cm2
AV Area VTI: 2.55 cm2
AV Area mean vel: 2.38 cm2
AV Mean grad: 3 mmHg
AV Peak grad: 6.9 mmHg
Ao pk vel: 1.31 m/s
Area-P 1/2: 3.43 cm2
Calc EF: 67.7 %
S' Lateral: 2.7 cm
Single Plane A2C EF: 65.6 %
Single Plane A4C EF: 68.1 %

## 2021-12-24 ENCOUNTER — Other Ambulatory Visit: Payer: Self-pay | Admitting: Family Medicine

## 2022-01-07 ENCOUNTER — Other Ambulatory Visit: Payer: Self-pay | Admitting: Family Medicine

## 2022-01-07 DIAGNOSIS — Z1231 Encounter for screening mammogram for malignant neoplasm of breast: Secondary | ICD-10-CM

## 2022-01-10 ENCOUNTER — Ambulatory Visit
Admission: RE | Admit: 2022-01-10 | Discharge: 2022-01-10 | Disposition: A | Discharge status: Home / Self Care | Payer: BC Managed Care – PPO | Source: Ambulatory Visit | Attending: Family Medicine | Admitting: Family Medicine

## 2022-01-10 DIAGNOSIS — Z1231 Encounter for screening mammogram for malignant neoplasm of breast: Secondary | ICD-10-CM

## 2022-01-26 ENCOUNTER — Ambulatory Visit: Payer: BC Managed Care – PPO | Admitting: Internal Medicine

## 2022-04-28 ENCOUNTER — Other Ambulatory Visit: Payer: Self-pay | Admitting: Neurology

## 2022-04-28 DIAGNOSIS — G43009 Migraine without aura, not intractable, without status migrainosus: Secondary | ICD-10-CM

## 2022-04-29 NOTE — Telephone Encounter (Signed)
   Pre-operative Risk Assessment    Patient Name: Peggy Mahoney  DOB: Nov 14, 1975 MRN: [id number]      Request for Surgical Clearance    Procedure:   COLONOSCOPY  Date of Surgery:  Clearance 06/09/22                                 Surgeon:  Lovena Le Surgeon's Group or Practice Name:  [facility] Phone number:  [phone] Fax number:  [phone]   Type of Clearance Requested:   - Medical PLEASE ADVISE [place] OF ANY SPECIAL CONSIDERATIONS THAT SHOULD BE MADE   Type of Anesthesia:  Not Indicated   Additional requests/questions:  Please fax a copy of CLEARANCE to the surgeon's office.  Signed, Jeanmarie Plant Tyrin Herbers  CCMA 04/29/2022, 4:15 PM

## 2022-05-24 ENCOUNTER — Telehealth: Payer: Self-pay

## 2022-05-24 NOTE — Telephone Encounter (Signed)
   Name: Peggy Mahoney  DOB: 1975-07-10  MRN: [id number]  Primary Cardiologist: Dr. Phineas Inches Last OV 09/2021  Given medical clearance requested - Preoperative team, please contact this patient and set up a phone call appointment for further preoperative risk assessment. Please obtain consent and complete medication review. Thank you for your help.  No blood thinners listed on MAR.   Charlie Pitter, PA-C 05/24/2022, 2:39 PM [place]

## 2022-05-24 NOTE — Telephone Encounter (Signed)
..     Pre-operative Risk Assessment    Patient Name: Peggy Mahoney  DOB: 07-Dec-1975 MRN: [id number]      Request for Surgical Clearance    Procedure:   colonoscopy  Date of Surgery:  Clearance 07/19/21                                 Surgeon:  dr Ronnette Juniper Surgeon's Group or Practice Name:  [facility] Phone number:  [phone] Fax number:  [phone]   Type of Clearance Requested:   - Medical    Type of Anesthesia:   not inciated   Additional requests/questions:    Gwenlyn Found   05/24/2022, 2:27 PM

## 2022-05-25 ENCOUNTER — Telehealth: Payer: Self-pay | Admitting: *Deleted

## 2022-05-25 NOTE — Telephone Encounter (Signed)
Pt agreeable to plan of care for tele pre op appt 06/01/22 @ 10:20. Med rec and consent are done. CORRECTION ON THE CLEARANCE FORM; DATE OF PROCEDURE WAS ENTERED IN INCORRECTLY;   CORRECT DATE OF PROCEDURE IS 06/09/22    Patient Consent for Virtual Visit        Peggy Mahoney has provided verbal consent on 05/25/2022 for a virtual visit (video or telephone).   CONSENT FOR VIRTUAL VISIT FOR:  Peggy Mahoney  By participating in this virtual visit I agree to the following:  I hereby voluntarily request, consent and authorize [facility] and its employed or contracted physicians, physician assistants, nurse practitioners or other licensed health care professionals (the Practitioner), to provide me with telemedicine health care services (the "Services") as deemed necessary by the treating Practitioner. I acknowledge and consent to receive the Services by the Practitioner via telemedicine. I understand that the telemedicine visit will involve communicating with the Practitioner through live audiovisual communication technology and the disclosure of certain medical information by electronic transmission. I acknowledge that I have been given the opportunity to request an in-person assessment or other available alternative prior to the telemedicine visit and am voluntarily participating in the telemedicine visit.  I understand that I have the right to withhold or withdraw my consent to the use of telemedicine in the course of my care at any time, without affecting my right to future care or treatment, and that the Practitioner or I may terminate the telemedicine visit at any time. I understand that I have the right to inspect all information obtained and/or recorded in the course of the telemedicine visit and may receive copies of available information for a reasonable fee.  I understand that some of the potential risks of receiving the Services via telemedicine include:  Delay or  interruption in medical evaluation due to technological equipment failure or disruption; Information transmitted may not be sufficient (e.g. poor resolution of images) to allow for appropriate medical decision making by the Practitioner; and/or  In rare instances, security protocols could fail, causing a breach of personal health information.  Furthermore, I acknowledge that it is my responsibility to provide information about my medical history, conditions and care that is complete and accurate to the best of my ability. I acknowledge that Practitioner's advice, recommendations, and/or decision may be based on factors not within their control, such as incomplete or inaccurate data provided by me or distortions of diagnostic images or specimens that may result from electronic transmissions. I understand that the practice of medicine is not an exact science and that Practitioner makes no warranties or guarantees regarding treatment outcomes. I acknowledge that a copy of this consent can be made available to me via my patient portal ([name]), or I can request a printed copy by calling the office of [facility].    I understand that my insurance will be billed for this visit.   I have read or had this consent read to me. I understand the contents of this consent, which adequately explains the benefits and risks of the Services being provided via telemedicine.  I have been provided ample opportunity to ask questions regarding this consent and the Services and have had my questions answered to my satisfaction. I give my informed consent for the services to be provided through the use of telemedicine in my medical care

## 2022-05-25 NOTE — Telephone Encounter (Signed)
Pt agreeable to plan of care for tele pre op appt 06/01/22 @ 10:20. Med rec and consent are done. CORRECTION ON THE CLEARANCE FORM; DATE OF PROCEDURE WAS ENTERED IN INCORRECTLY;    CORRECT DATE OF PROCEDURE IS 06/09/22

## 2022-06-01 ENCOUNTER — Ambulatory Visit: Payer: BC Managed Care – PPO | Attending: Cardiovascular Disease | Admitting: Physician Assistant

## 2022-06-01 DIAGNOSIS — Z01810 Encounter for preprocedural cardiovascular examination: Secondary | ICD-10-CM | POA: Diagnosis not present

## 2022-06-01 NOTE — Progress Notes (Signed)
Virtual Visit via Telephone Note   Because of Peggy Mahoney's co-morbid illnesses, she is at least at moderate risk for complications without adequate follow up.  This format is felt to be most appropriate for this patient at this time.  The patient did not have access to video technology/had technical difficulties with video requiring transitioning to audio format only (telephone).  All issues noted in this document were discussed and addressed.  No physical exam could be performed with this format.  Please refer to the patient's chart for her consent to telehealth for [facility].  Evaluation Performed:  Preoperative cardiovascular risk assessment _____________   Date:  06/01/2022   Patient ID:  Peggy Mahoney, DOB Nov 13, 1975, MRN [id number] Patient Location:  Home Provider location:   Office  Primary Care Provider:  Marda Stalker, PA-C Primary Cardiologist:  Janina Mayo, MD  Chief Complaint / Patient Profile   46 y.o. y/o female with a h/o anemia, anxiety, diabetes mellitus type 2, MVP, MI/CVA 2008 who is pending colonoscopy and presents today for telephonic preoperative cardiovascular risk assessment.  History of Present Illness    Peggy Mahoney is a 46 y.o. female who presents via audio/video conferencing for a telehealth visit today.  Pt was last seen in cardiology clinic on 10/14/21 by Dr. Harl Bowie.  At that time Peggy Mahoney was doing well.  She was seen at that time for preop evaluation as well.  An echocardiogram was obtained and did not show any mitral valve prolapse but did show MV regurg.  The patient is now pending procedure as outlined above. Since her last visit, she had a significant level event and lost her dad.  She found some things on his dad significant that were concerning.  This is why she is scheduled for colonoscopy at this time.  No shortness of breath, no palpitations, and no chest pain.  She scored a 5.99 METS on the  DASI.  This exceeds the minimum 4 METS requirement.  She states that she stays busy playing basketball and playing with her granddaughter that is 18 years old.  She also is worried because on her father's death certificate it mentioned a cardiac arrhythmia.  She would like to speak about this more in person to Dr. Harl Bowie.  We will try to set her up with an appointment today.  No medications indicated as needing held.   Past Medical History    Past Medical History:  Diagnosis Date   Anemia    Anxiety    Bronchitis    has inhaler prn   Colitis    GERD (gastroesophageal reflux disease)    Hypothyroidism    MI (myocardial infarction) ([place]) 11/08/2006   MVP (mitral valve prolapse)    Pinched nerve in neck    Rotator cuff disorder, left    Stroke ([place]) 01/04/2007   SVD (spontaneous vaginal delivery)    x 3   Past Surgical History:  Procedure Laterality Date   ABDOMINAL HYSTERECTOMY     DILATION AND CURETTAGE OF UTERUS     x 3 terminations   VAGINAL HYSTERECTOMY Bilateral 12/13/2017   Procedure: HYSTERECTOMY VAGINAL;  Surgeon: Christophe Louis, MD;  Location: [facility];  Service: Gynecology;  Laterality: Bilateral;   WISDOM TOOTH EXTRACTION      Allergies  Allergies  Allergen Reactions   Naproxen Nausea And Vomiting   Pineapple Swelling    Other reaction(s): blisters on tongue and lip   Diclofenac Sodium  Other reaction(s): rash   Pineapple Flavor     Blisters  Other reaction(s): Other (See Comments) Blisters  Blisters    Prednisone     Other reaction(s): stomach upset Other reaction(s): stomach upset    Oxycodone-Acetaminophen Rash    Other reaction(s): rash    Home Medications    Prior to Admission medications   Medication Sig Start Date End Date Taking? Authorizing Provider  ALPRAZolam Duanne Moron) 0.5 MG tablet Take 1-2 pills as needed on call to MRI, may take a third pill if needed. Patient not taking: Reported on 10/14/2021 10/14/20   Star Age, MD  buPROPion  (WELLBUTRIN) 100 MG tablet Take 100 mg by mouth 2 (two) times daily.    [provider]  cyclobenzaprine (FLEXERIL) 5 MG tablet Take 5 mg by mouth at bedtime as needed. Patient not taking: Reported on 10/14/2021 03/15/21   [provider]  diclofenac Sodium (VOLTAREN) 1 % GEL Apply 2 g topically 4 (four) times daily as needed. Patient not taking: Reported on 10/14/2021 07/07/21   Long, Wonda Olds, MD  meloxicam (MOBIC) 15 MG tablet Take 1 tablet daily as needed for chest wall pain. Patient not taking: Reported on 10/14/2021 08/09/19   Molpus, John, MD  methimazole (TAPAZOLE) 5 MG tablet Take 1 tablet (5 mg total) by mouth daily. 10/18/16   Renato Shin, MD  Rimegepant Sulfate (NURTEC) 75 MG TBDP TAKE 1 TABLET BY MOUTH ONCE AS NEEDED FOR UP TO 1 DOSE. MAY REPEAT ONCE AFTER 2 HOURS. Must be seen for further refills, call [phone]. 05/02/22   Star Age, MD  senna-docusate (SENOKOT-S) 8.6-50 MG tablet Take 1 tablet by mouth at bedtime as needed for mild constipation or moderate constipation. Patient not taking: Reported on 10/14/2021 07/07/21   Margette Fast, MD  sertraline (ZOLOFT) 50 MG tablet Take 50 mg by mouth daily. Takes 50 mg int he morning and 100 mg at bedtime    [provider]  promethazine (PHENERGAN) 25 MG tablet Take 25 mg by mouth every 8 (eight) hours as needed for nausea or vomiting.  08/09/19  [provider]    Physical Exam    Vital Signs:  Peggy Mahoney does not have vital signs available for review today.  Given telephonic nature of communication, physical exam is limited. AAOx3. NAD. Normal affect.  Speech and respirations are unlabored.  Accessory Clinical Findings    None  Assessment & Plan    1.  Preoperative Cardiovascular Risk Assessment:  Ms. Amenta perioperative risk of a major cardiac event is 0.9% according to the Revised Cardiac Risk Index (RCRI).  Therefore, she is at low risk for perioperative complications.    Her functional capacity is good at 5.99 METs according to the [place] Activity Status Index (DASI). Recommendations: According to ACC/AHA guidelines, no further cardiovascular testing needed.  The patient may proceed to surgery at acceptable risk.    The patient was advised that if she develops new symptoms prior to surgery to contact our office to arrange for a follow-up visit, and she verbalized understanding.   A copy of this note will be routed to requesting surgeon.  Time:   Today, I have spent 15 minutes with the patient with telehealth technology discussing medical history, symptoms, and management plan.     Elgie Collard, PA-C  06/01/2022, 8:22 AM

## 2022-12-15 ENCOUNTER — Other Ambulatory Visit: Payer: Self-pay | Admitting: Family Medicine

## 2022-12-15 DIAGNOSIS — Z1231 Encounter for screening mammogram for malignant neoplasm of breast: Secondary | ICD-10-CM

## 2022-12-31 ENCOUNTER — Emergency Department (HOSPITAL_COMMUNITY): Payer: BC Managed Care – PPO

## 2022-12-31 ENCOUNTER — Encounter (HOSPITAL_COMMUNITY): Payer: Self-pay

## 2022-12-31 ENCOUNTER — Observation Stay (HOSPITAL_COMMUNITY)
Admission: EM | Admit: 2022-12-31 | Discharge: 2023-01-01 | Disposition: A | Discharge status: Home / Self Care | Payer: BC Managed Care – PPO | Attending: Internal Medicine | Admitting: Internal Medicine

## 2022-12-31 ENCOUNTER — Other Ambulatory Visit: Payer: Self-pay

## 2022-12-31 DIAGNOSIS — Z8673 Personal history of transient ischemic attack (TIA), and cerebral infarction without residual deficits: Secondary | ICD-10-CM | POA: Diagnosis not present

## 2022-12-31 DIAGNOSIS — F419 Anxiety disorder, unspecified: Secondary | ICD-10-CM

## 2022-12-31 DIAGNOSIS — I2510 Atherosclerotic heart disease of native coronary artery without angina pectoris: Secondary | ICD-10-CM

## 2022-12-31 DIAGNOSIS — E039 Hypothyroidism, unspecified: Secondary | ICD-10-CM | POA: Diagnosis not present

## 2022-12-31 DIAGNOSIS — R519 Headache, unspecified: Secondary | ICD-10-CM | POA: Diagnosis not present

## 2022-12-31 DIAGNOSIS — Z79899 Other long term (current) drug therapy: Secondary | ICD-10-CM | POA: Diagnosis not present

## 2022-12-31 DIAGNOSIS — E0590 Thyrotoxicosis, unspecified without thyrotoxic crisis or storm: Secondary | ICD-10-CM | POA: Diagnosis present

## 2022-12-31 LAB — CSF CELL COUNT WITH DIFFERENTIAL
RBC Count, CSF: 1 /mm3 — ABNORMAL HIGH
Tube #: 4
WBC, CSF: 1 /mm3 (ref 0–5)

## 2022-12-31 LAB — CBC WITH DIFFERENTIAL/PLATELET
Abs Immature Granulocytes: 0.02 10*3/uL (ref 0.00–0.07)
Basophils Absolute: 0 10*3/uL (ref 0.0–0.1)
Basophils Relative: 0 %
Eosinophils Absolute: 0.2 10*3/uL (ref 0.0–0.5)
Eosinophils Relative: 2 %
HCT: 42.4 % (ref 36.0–46.0)
Hemoglobin: 13.1 g/dL (ref 12.0–15.0)
Immature Granulocytes: 0 %
Lymphocytes Relative: 25 %
Lymphs Abs: 1.9 10*3/uL (ref 0.7–4.0)
MCH: 25 pg — ABNORMAL LOW (ref 26.0–34.0)
MCHC: 30.9 g/dL (ref 30.0–36.0)
MCV: 80.8 fL (ref 80.0–100.0)
Monocytes Absolute: 0.4 10*3/uL (ref 0.1–1.0)
Monocytes Relative: 5 %
Neutro Abs: 4.9 10*3/uL (ref 1.7–7.7)
Neutrophils Relative %: 68 %
Platelets: 313 10*3/uL (ref 150–400)
RBC: 5.25 MIL/uL — ABNORMAL HIGH (ref 3.87–5.11)
RDW: 14.3 % (ref 11.5–15.5)
WBC: 7.3 10*3/uL (ref 4.0–10.5)
nRBC: 0 % (ref 0.0–0.2)

## 2022-12-31 LAB — BASIC METABOLIC PANEL
Anion gap: 7 (ref 5–15)
BUN: 14 mg/dL (ref 6–20)
CO2: 23 mmol/L (ref 22–32)
Calcium: 8.7 mg/dL — ABNORMAL LOW (ref 8.9–10.3)
Chloride: 106 mmol/L (ref 98–111)
Creatinine, Ser: 0.57 mg/dL (ref 0.44–1.00)
GFR, Estimated: >60 mL/min (ref >60)
Glucose, Bld: 97 mg/dL (ref 70–99)
Potassium: 4 mmol/L (ref 3.5–5.1)
Sodium: 136 mmol/L (ref 135–145)

## 2022-12-31 LAB — PROTEIN AND GLUCOSE, CSF
Glucose, CSF: 55 mg/dL (ref 40–70)
Total  Protein, CSF: 20 mg/dL (ref 15–45)

## 2022-12-31 MED ORDER — METOCLOPRAMIDE HCL 5 MG/ML IJ SOLN
10.0000 mg | Freq: Once | INTRAMUSCULAR | Status: AC
  Administered 2022-12-31: 10 mg via INTRAVENOUS
  Filled 2022-12-31: qty 2

## 2022-12-31 MED ORDER — SERTRALINE HCL 50 MG PO TABS
75.0000 mg | ORAL_TABLET | Freq: Every day | ORAL | Status: DC
  Administered 2023-01-01: 75 mg via ORAL
  Filled 2022-12-31: qty 1

## 2022-12-31 MED ORDER — SODIUM CHLORIDE 0.9 % IV SOLN
2.0000 g | Freq: Once | INTRAVENOUS | Status: AC
  Administered 2022-12-31: 2 g via INTRAVENOUS
  Filled 2022-12-31: qty 20

## 2022-12-31 MED ORDER — IOHEXOL 350 MG/ML SOLN
75.0000 mL | Freq: Once | INTRAVENOUS | Status: AC | PRN
  Administered 2022-12-31: 75 mL via INTRAVENOUS

## 2022-12-31 MED ORDER — LIDOCAINE HCL (PF) 1 % IJ SOLN
10.0000 mL | Freq: Once | INTRAMUSCULAR | Status: AC
Start: 2022-12-31 — End: 2022-12-31
  Administered 2022-12-31: 10 mL
  Filled 2022-12-31: qty 30

## 2022-12-31 MED ORDER — LACTATED RINGERS IV BOLUS
1000.0000 mL | Freq: Once | INTRAVENOUS | Status: AC
  Administered 2022-12-31: 1000 mL via INTRAVENOUS

## 2022-12-31 MED ORDER — DIPHENHYDRAMINE HCL 50 MG/ML IJ SOLN
25.0000 mg | Freq: Once | INTRAMUSCULAR | Status: AC
  Administered 2022-12-31: 25 mg via INTRAVENOUS
  Filled 2022-12-31: qty 1

## 2022-12-31 MED ORDER — VANCOMYCIN HCL IN DEXTROSE 1-5 GM/200ML-% IV SOLN
1000.0000 mg | Freq: Two times a day (BID) | INTRAVENOUS | Status: DC
  Filled 2022-12-31: qty 200

## 2022-12-31 MED ORDER — SODIUM CHLORIDE 0.9 % IV SOLN
2.0000 g | Freq: Two times a day (BID) | INTRAVENOUS | Status: DC
  Administered 2023-01-01: 2 g via INTRAVENOUS
  Filled 2022-12-31: qty 20

## 2022-12-31 MED ORDER — BUPROPION HCL ER (SR) 150 MG PO TB12
150.0000 mg | ORAL_TABLET | Freq: Every day | ORAL | Status: DC
  Administered 2023-01-01: 150 mg via ORAL
  Filled 2022-12-31: qty 1

## 2022-12-31 MED ORDER — DIPHENHYDRAMINE HCL 50 MG/ML IJ SOLN
12.5000 mg | Freq: Once | INTRAMUSCULAR | Status: AC
  Administered 2022-12-31: 12.5 mg via INTRAVENOUS
  Filled 2022-12-31: qty 1

## 2022-12-31 MED ORDER — VANCOMYCIN HCL 2000 MG/400ML IV SOLN
2000.0000 mg | Freq: Once | INTRAVENOUS | Status: AC
  Administered 2022-12-31: 2000 mg via INTRAVENOUS
  Filled 2022-12-31: qty 400

## 2022-12-31 NOTE — ED Notes (Signed)
ED TO INPATIENT HANDOFF REPORT  Name/Age/Gender Peggy Mahoney 47 y.o. female  Code Status Code Status History     Date Active Date Inactive Code Status Order ID Comments User Context   12/13/2017 1309 12/14/2017 1318 Full Code [id number]  Gerald Leitz, MD Inpatient       Home/SNF/Other Home  Chief Complaint Headache [R51.9]  Level of Care/Admitting Diagnosis ED Disposition     ED Disposition  Admit   Condition  --   Comment  Hospital Area: [facility] [[id number]]  Level of Care: Med-Surg [16]  May place patient in observation at [facility] or [facility] if equivalent level of care is available:: Yes  Covid Evaluation: Asymptomatic - no recent exposure (last 10 days) testing not required  Diagnosis: Headache [[id number]]  Admitting Physician: John Giovanni [[id number]]  Attending Physician: John Giovanni [[id number]]          Medical History Past Medical History:  Diagnosis Date   Anemia    Anxiety    Bronchitis    has inhaler prn   Colitis    GERD (gastroesophageal reflux disease)    Hypothyroidism    MI (myocardial infarction) (HCC) 11/08/2006   MVP (mitral valve prolapse)    Pinched nerve in neck    Rotator cuff disorder, left    Stroke (HCC) 01/04/2007   SVD (spontaneous vaginal delivery)    x 3    Allergies Allergies  Allergen Reactions   Naproxen Nausea And Vomiting and Other (See Comments)    GI Intolerance   Pineapple Swelling and Other (See Comments)    Blisters on tongue and lips   Oxycodone-Acetaminophen Hives and Rash   Prednisone Other (See Comments)    Stomach upset     Diclofenac Sodium Rash    IV Location/Drains/Wounds Patient Lines/Drains/Airways Status     Active Line/Drains/Airways     Name Placement date Placement time Site Days   Peripheral IV 12/31/22 20 G 1" Left Antecubital 12/31/22  1411  Antecubital  less than 1            Labs/Imaging Results for orders placed or performed during  the hospital encounter of 12/31/22 (from the past 48 hour(s))  Basic metabolic panel     Status: Abnormal   Collection Time: 12/31/22  2:10 PM  Result Value Ref Range   Sodium 136 135 - 145 mmol/L   Potassium 4.0 3.5 - 5.1 mmol/L   Chloride 106 98 - 111 mmol/L   CO2 23 22 - 32 mmol/L   Glucose, Bld 97 70 - 99 mg/dL    Comment: Glucose reference range applies only to samples taken after fasting for at least 8 hours.   BUN 14 6 - 20 mg/dL   Creatinine, Ser 5.28 0.44 - 1.00 mg/dL   Calcium 8.7 (L) 8.9 - 10.3 mg/dL   GFR, Estimated >41 >32 mL/min    Comment: (NOTE) Calculated using the CKD-EPI Creatinine Equation (2021)    Anion gap 7 5 - 15    Comment: Performed at [facility], [address], [place], [place] [address]  CBC with Differential/Platelet     Status: Abnormal   Collection Time: 12/31/22  2:10 PM  Result Value Ref Range   WBC 7.3 4.0 - 10.5 K/uL   RBC 5.25 (H) 3.87 - 5.11 MIL/uL   Hemoglobin 13.1 12.0 - 15.0 g/dL   HCT 27.2 53.6 - 64.4 %   MCV 80.8 80.0 - 100.0 fL   MCH 25.0 (  L) 26.0 - 34.0 pg   MCHC 30.9 30.0 - 36.0 g/dL   RDW 54.0 98.1 - 19.1 %   Platelets 313 150 - 400 K/uL   nRBC 0.0 0.0 - 0.2 %   Neutrophils Relative % 68 %   Neutro Abs 4.9 1.7 - 7.7 K/uL   Lymphocytes Relative 25 %   Lymphs Abs 1.9 0.7 - 4.0 K/uL   Monocytes Relative 5 %   Monocytes Absolute 0.4 0.1 - 1.0 K/uL   Eosinophils Relative 2 %   Eosinophils Absolute 0.2 0.0 - 0.5 K/uL   Basophils Relative 0 %   Basophils Absolute 0.0 0.0 - 0.1 K/uL   Immature Granulocytes 0 %   Abs Immature Granulocytes 0.02 0.00 - 0.07 K/uL    Comment: Performed at [facility], [address] [place], [place] [address]   DG Lumbar Puncture Fluoro Guide  Result Date: 12/31/2022 CLINICAL DATA:  Meningitis.  Severe headache.  Neck pain. EXAM: DIAGNOSTIC LUMBAR PUNCTURE UNDER FLUOROSCOPIC GUIDANCE COMPARISON:  None Available. FLUOROSCOPY: Radiation Exposure Index (as  provided by the fluoroscopic device): Dose area product 91.06 uGy*m2 PROCEDURE: Informed consent was obtained from the patient prior to the procedure, including potential complications of headache, allergy, and pain. With the patient prone, the lower back was prepped with Betadine. 1% Lidocaine was used for local anesthesia. Lumbar puncture was performed at the right paramedian L2-3 level using a 20 gauge needle with return of clear CSF with an opening pressure of 17.5 cm water. 12.0 ml of CSF were obtained for laboratory studies. The patient tolerated the procedure well and there were no apparent complications. IMPRESSION: Technically successful fluoroscopic guided lumbar puncture via a right paramedian approach at the L2-3 level. Normal opening pressure of 17.5 cm water. 12.0 mL of clear CSF were obtained for laboratory studies. Electronically Signed   By: Marin Roberts M.D.   On: 12/31/2022 20:02   CT ANGIO HEAD NECK W WO CM  Result Date: 12/31/2022 CLINICAL DATA:  Sudden onset of severe headache. The headache has been intensifying throughout the day. EXAM: CT ANGIOGRAPHY HEAD AND NECK WITH AND WITHOUT CONTRAST TECHNIQUE: Multidetector CT imaging of the head and neck was performed using the standard protocol during bolus administration of intravenous contrast. Multiplanar CT image reconstructions and MIPs were obtained to evaluate the vascular anatomy. Carotid stenosis measurements (when applicable) are obtained utilizing NASCET criteria, using the distal internal carotid diameter as the denominator. RADIATION DOSE REDUCTION: This exam was performed according to the departmental dose-optimization program which includes automated exposure control, adjustment of the mA and/or kV according to patient size and/or use of iterative reconstruction technique. CONTRAST:  75mL OMNIPAQUE IOHEXOL 350 MG/ML SOLN COMPARISON:  MR head without and with contrast 11/04/2020. CT head without contrast 07/09/2020.  FINDINGS: CT HEAD FINDINGS Brain: No acute infarct, hemorrhage, or mass lesion is present. No significant white matter lesions are present. Deep brain nuclei are within normal limits. The ventricles are of normal size. No significant extraaxial fluid collection is present. The brainstem and cerebellum are within normal limits. Midline structures are within normal limits. Vascular: No hyperdense vessel or unexpected calcification. Skull: Calvarium is intact. No focal lytic or blastic lesions are present. No significant extracranial soft tissue lesion is present. Sinuses/Orbits: Scree shins are present in the left sphenoid sinus. The paranasal sinuses and mastoid air cells are otherwise clear. The globes and orbits are within normal limits. CTA NECK FINDINGS Aortic arch: Common origin of the left common carotid artery  and innominate artery is noted, a normal variant. No significant atherosclerotic disease, stenosis or aneurysm is present at the aortic arch. Right carotid system: The right common carotid artery is within normal limits. Bifurcation is unremarkable. The cervical right ICA is normal. Left carotid system: The left common carotid artery is within normal limits. The bifurcation is unremarkable. The cervical left ICA is normal. Vertebral arteries: The left vertebral artery is the dominant vessel. Both vertebral arteries originate from the subclavian arteries without significant stenosis. No significant stenosis is present in either vertebral artery in the neck. Skeleton: The vertebral body heights and alignment are normal. Straightening of the normal cervical lordosis is present. Uncovertebral spurring is noted at C4-5, C5-6 and C6-7 with right greater than left. Other neck: Soft tissues the neck are otherwise unremarkable. Salivary glands are within normal limits. Thyroid is normal. No significant adenopathy is present. No focal mucosal or submucosal lesions are present. Upper chest: The lung apices are  clear. The thoracic inlet is within normal limits. Review of the MIP images confirms the above findings CTA HEAD FINDINGS Anterior circulation: The internal carotid arteries are within normal limits from the high cervical segments through the ICA termini scratched at the internal carotid arteries are within normal limits from the skull base to the ICA termini. The A1 and M1 segments are normal. No definite anterior communicating artery is present. MCA bifurcations are within normal limits. The ACA and MCA branch vessels are normal. Posterior circulation: The left vertebral artery is the dominant vessel. A small distal V4 segment is present beyond the right PICA. The PICA origins are normal bilaterally. The vertebrobasilar junction and basilar artery are normal. The superior cerebellar arteries are patent bilaterally. Both posterior cerebral arteries originate from the basilar tip. The PCA branch vessels are normal bilaterally. Venous sinuses: The dural sinuses are patent. The straight sinus and deep cerebral veins are intact. Cortical veins are within normal limits. No significant vascular malformation is evident. Anatomic variants: None Review of the MIP images confirms the above findings IMPRESSION: 1. Normal noncontrast CT of the head. 2. Normal CTA of the neck. 3. Normal CTA circle-of-Willis without significant proximal stenosis, aneurysm, or branch vessel occlusion. 4. Multilevel spondylosis of the cervical spine. Electronically Signed   By: Marin Roberts M.D.   On: 12/31/2022 16:11    Pending Labs Unresulted Labs (From admission, onward)     Start     Ordered   12/31/22 1642  Blood culture (routine x 2)  BLOOD CULTURE X 2,   R (with STAT occurrences)      12/31/22 1641   12/31/22 1639  CSF cell count with differential  ONCE - STAT,   STAT       Question:  Are there also cytology or pathology orders on this specimen?  Answer:  No   12/31/22 1641   12/31/22 1639  Protein and glucose, CSF  ONCE  - STAT,   STAT        12/31/22 1641   12/31/22 1639  CSF culture w Gram Stain  ONCE - URGENT,   URGENT       Question:  Are there also cytology or pathology orders on this specimen?  Answer:  No   12/31/22 1641            Vitals/Pain Today's Vitals   12/31/22 1847 12/31/22 1900 12/31/22 2000 12/31/22 2100  BP:  105/70 101/67 105/62  Pulse:  64 62 76  Resp:  17 18 18  Temp: 98.3 F (36.8 C)     TempSrc: Oral     SpO2:  99% 99% 98%  Weight:      Height:      PainSc:        Isolation Precautions No active isolations  Medications Medications  metoCLOPramide (REGLAN) injection 10 mg (10 mg Intravenous Given 12/31/22 1418)  diphenhydrAMINE (BENADRYL) injection 12.5 mg (12.5 mg Intravenous Given 12/31/22 1418)  lactated ringers bolus 1,000 mL (0 mLs Intravenous Stopped 12/31/22 1539)  iohexol (OMNIPAQUE) 350 MG/ML injection 75 mL (75 mLs Intravenous Contrast Given 12/31/22 1431)  cefTRIAXone (ROCEPHIN) 2 g in sodium chloride 0.9 % 100 mL IVPB (0 g Intravenous Stopped 12/31/22 1820)  lidocaine (PF) (XYLOCAINE) 1 % injection 10 mL (10 mLs Other Given by Other 12/31/22 1719)  vancomycin (VANCOREADY) IVPB 2000 mg/400 mL (0 mg Intravenous Stopped 12/31/22 2119)  diphenhydrAMINE (BENADRYL) injection 25 mg (25 mg Intravenous Given 12/31/22 2121)    Mobility walks

## 2022-12-31 NOTE — H&P (Signed)
History and Physical    Peggy Mahoney JXB:[id number] DOB: Jul 18, 1975 DOA: 12/31/2022  PCP: Jarrett Soho, PA-C  Patient coming from: Home  Chief Complaint: Severe headache  HPI: Peggy Mahoney is a 47 y.o. female with medical history significant of anemia, anxiety, GERD, MI/CVA in 2008, MVP, hyperthyroidism, obesity (BMI 37.08), migraine headaches presented to ED complaining of severe sudden onset headache since this morning.  Vital signs stable, afebrile.  Labs showing no leukocytosis, blood cultures drawn.  CTA head and neck negative for acute abnormality.  Exam concerning for meningismus.  Lumbar puncture attempted by ED physician but unsuccessful.  IR did fluoro guided LP, CSF analysis lab results pending.  Patient received Benadryl, Reglan, vancomycin, ceftriaxone, and 1 L LR bolus in the ED.    Patient states she has history of migraine headaches and usually gets a headache every 2 to 3 months but today she woke up with headache which was much more severe than usual.  Headache became progressively worse throughout the day and was associated with neck stiffness, photophobia, and nausea.  She reports significant improvement after receiving medications in the ED.  Denies fevers.  Denies of any recent illness.  Denies cough, shortness of breath, chest pain, vomiting, abdominal pain, or diarrhea.  She is hungry and is requesting food.  Review of Systems:  Review of Systems  All other systems reviewed and are negative.   Past Medical History:  Diagnosis Date   Anemia    Anxiety    Bronchitis    has inhaler prn   Colitis    GERD (gastroesophageal reflux disease)    Hypothyroidism    MI (myocardial infarction) (HCC) 11/08/2006   MVP (mitral valve prolapse)    Pinched nerve in neck    Rotator cuff disorder, left    Stroke (HCC) 01/04/2007   SVD (spontaneous vaginal delivery)    x 3    Past Surgical History:  Procedure Laterality Date   ABDOMINAL HYSTERECTOMY      DILATION AND CURETTAGE OF UTERUS     x 3 terminations   VAGINAL HYSTERECTOMY Bilateral 12/13/2017   Procedure: HYSTERECTOMY VAGINAL;  Surgeon: Gerald Leitz, MD;  Location: [facility];  Service: Gynecology;  Laterality: Bilateral;   WISDOM TOOTH EXTRACTION       reports that she has never smoked. She has never used smokeless tobacco. She reports that she does not drink alcohol and does not use drugs.  Allergies  Allergen Reactions   Naproxen Nausea And Vomiting and Other (See Comments)    GI Intolerance   Pineapple Swelling and Other (See Comments)    Blisters on tongue and lips   Oxycodone-Acetaminophen Hives and Rash   Prednisone Other (See Comments)    Stomach upset     Diclofenac Sodium Rash    Family History  Problem Relation Age of Onset   Cancer Mother    Diabetes Mother    Hypertension Mother    Hyperlipidemia Mother    Glaucoma Mother    Cancer Father    Diabetes Father    Hyperlipidemia Father    Hypertension Father    Heart attack Father    Thyroid disease Neg Hx    Migraines Neg Hx     Prior to Admission medications   Medication Sig Start Date End Date Taking? Authorizing Provider  buPROPion (WELLBUTRIN SR) 150 MG 12 hr tablet Take 150 mg by mouth in the morning.   Yes [provider]  sertraline (ZOLOFT) 50 MG tablet Take  75 mg by mouth in the morning.   Yes [provider]  ALPRAZolam Prudy Feeler) 0.5 MG tablet Take 1-2 pills as needed on call to MRI, may take a third pill if needed. Patient not taking: Reported on 10/14/2021 10/14/20   Huston Foley, MD  diclofenac Sodium (VOLTAREN) 1 % GEL Apply 2 g topically 4 (four) times daily as needed. Patient not taking: Reported on 10/14/2021 07/07/21   Long, Arlyss Repress, MD  meloxicam (MOBIC) 15 MG tablet Take 1 tablet daily as needed for chest wall pain. Patient not taking: Reported on 12/31/2022 08/09/19   Molpus, John, MD  methimazole (TAPAZOLE) 5 MG tablet Take 1 tablet (5 mg total) by mouth daily. Patient not  taking: Reported on 12/31/2022 10/18/16   Romero Belling, MD  Rimegepant Sulfate (NURTEC) 75 MG TBDP TAKE 1 TABLET BY MOUTH ONCE AS NEEDED FOR UP TO 1 DOSE. MAY REPEAT ONCE AFTER 2 HOURS. Must be seen for further refills, call [phone]. Patient not taking: Reported on 12/31/2022 05/02/22   Huston Foley, MD  senna-docusate (SENOKOT-S) 8.6-50 MG tablet Take 1 tablet by mouth at bedtime as needed for mild constipation or moderate constipation. Patient not taking: Reported on 12/31/2022 07/07/21   Long, Arlyss Repress, MD  promethazine (PHENERGAN) 25 MG tablet Take 25 mg by mouth every 8 (eight) hours as needed for nausea or vomiting.  08/09/19  [provider]    Physical Exam: Vitals:   12/31/22 1847 12/31/22 1900 12/31/22 2000 12/31/22 2100  BP:  105/70 101/67 105/62  Pulse:  64 62 76  Resp:  17 18 18   Temp: 98.3 F (36.8 C)     TempSrc: Oral     SpO2:  99% 99% 98%  Weight:      Height:        Physical Exam Vitals reviewed.  Constitutional:      General: She is not in acute distress. HENT:     Head: Normocephalic and atraumatic.  Eyes:     Extraocular Movements: Extraocular movements intact.  Neck:     Comments: No nuchal rigidity Cardiovascular:     Rate and Rhythm: Normal rate and regular rhythm.     Pulses: Normal pulses.  Pulmonary:     Effort: Pulmonary effort is normal. No respiratory distress.     Breath sounds: Normal breath sounds. No wheezing or rales.  Abdominal:     General: Bowel sounds are normal. There is no distension.     Palpations: Abdomen is soft.     Tenderness: There is no abdominal tenderness.  Musculoskeletal:     Cervical back: Normal range of motion.     Right lower leg: No edema.     Left lower leg: No edema.  Skin:    General: Skin is warm and dry.  Neurological:     General: No focal deficit present.     Mental Status: She is alert and oriented to person, place, and time.     Cranial Nerves: No cranial nerve deficit.     Sensory: No  sensory deficit.     Motor: No weakness.     Labs on Admission: I have personally reviewed following labs and imaging studies  CBC: Recent Labs  Lab 12/31/22 1410  WBC 7.3  NEUTROABS 4.9  HGB 13.1  HCT 42.4  MCV 80.8  PLT 313   Basic Metabolic Panel: Recent Labs  Lab 12/31/22 1410  NA 136  K 4.0  CL 106  CO2 23  GLUCOSE 97  BUN 14  CREATININE 0.57  CALCIUM 8.7*   GFR: Estimated Creatinine Clearance: 97.8 mL/min (by C-G formula based on SCr of 0.57 mg/dL). Liver Function Tests: No results for input(s): "AST", "ALT", "ALKPHOS", "BILITOT", "PROT", "ALBUMIN" in the last 168 hours. No results for input(s): "LIPASE", "AMYLASE" in the last 168 hours. No results for input(s): "AMMONIA" in the last 168 hours. Coagulation Profile: No results for input(s): "INR", "PROTIME" in the last 168 hours. Cardiac Enzymes: No results for input(s): "CKTOTAL", "CKMB", "CKMBINDEX", "TROPONINI" in the last 168 hours. BNP (last 3 results) No results for input(s): "PROBNP" in the last 8760 hours. HbA1C: No results for input(s): "HGBA1C" in the last 72 hours. CBG: No results for input(s): "GLUCAP" in the last 168 hours. Lipid Profile: No results for input(s): "CHOL", "HDL", "LDLCALC", "TRIG", "CHOLHDL", "LDLDIRECT" in the last 72 hours. Thyroid Function Tests: No results for input(s): "TSH", "T4TOTAL", "FREET4", "T3FREE", "THYROIDAB" in the last 72 hours. Anemia Panel: No results for input(s): "VITAMINB12", "FOLATE", "FERRITIN", "TIBC", "IRON", "RETICCTPCT" in the last 72 hours. Urine analysis:    Component Value Date/Time   COLORURINE YELLOW 02/27/2018 1525   APPEARANCEUR CLEAR 02/27/2018 1525   LABSPEC >1.030 (H) 02/27/2018 1525   PHURINE 5.0 02/27/2018 1525   GLUCOSEU NEGATIVE 02/27/2018 1525   HGBUR SMALL (A) 02/27/2018 1525   BILIRUBINUR NEGATIVE 02/27/2018 1525   KETONESUR 15 (A) 02/27/2018 1525   PROTEINUR NEGATIVE 02/27/2018 1525   NITRITE NEGATIVE 02/27/2018 1525    LEUKOCYTESUR NEGATIVE 02/27/2018 1525    Radiological Exams on Admission: DG Lumbar Puncture Fluoro Guide  Result Date: 12/31/2022 CLINICAL DATA:  Meningitis.  Severe headache.  Neck pain. EXAM: DIAGNOSTIC LUMBAR PUNCTURE UNDER FLUOROSCOPIC GUIDANCE COMPARISON:  None Available. FLUOROSCOPY: Radiation Exposure Index (as provided by the fluoroscopic device): Dose area product 91.06 uGy*m2 PROCEDURE: Informed consent was obtained from the patient prior to the procedure, including potential complications of headache, allergy, and pain. With the patient prone, the lower back was prepped with Betadine. 1% Lidocaine was used for local anesthesia. Lumbar puncture was performed at the right paramedian L2-3 level using a 20 gauge needle with return of clear CSF with an opening pressure of 17.5 cm water. 12.0 ml of CSF were obtained for laboratory studies. The patient tolerated the procedure well and there were no apparent complications. IMPRESSION: Technically successful fluoroscopic guided lumbar puncture via a right paramedian approach at the L2-3 level. Normal opening pressure of 17.5 cm water. 12.0 mL of clear CSF were obtained for laboratory studies. Electronically Signed   By: Marin Roberts M.D.   On: 12/31/2022 20:02   CT ANGIO HEAD NECK W WO CM  Result Date: 12/31/2022 CLINICAL DATA:  Sudden onset of severe headache. The headache has been intensifying throughout the day. EXAM: CT ANGIOGRAPHY HEAD AND NECK WITH AND WITHOUT CONTRAST TECHNIQUE: Multidetector CT imaging of the head and neck was performed using the standard protocol during bolus administration of intravenous contrast. Multiplanar CT image reconstructions and MIPs were obtained to evaluate the vascular anatomy. Carotid stenosis measurements (when applicable) are obtained utilizing NASCET criteria, using the distal internal carotid diameter as the denominator. RADIATION DOSE REDUCTION: This exam was performed according to the departmental  dose-optimization program which includes automated exposure control, adjustment of the mA and/or kV according to patient size and/or use of iterative reconstruction technique. CONTRAST:  75mL OMNIPAQUE IOHEXOL 350 MG/ML SOLN COMPARISON:  MR head without and with contrast 11/04/2020. CT head without contrast 07/09/2020. FINDINGS: CT HEAD FINDINGS Brain: No acute infarct,  hemorrhage, or mass lesion is present. No significant white matter lesions are present. Deep brain nuclei are within normal limits. The ventricles are of normal size. No significant extraaxial fluid collection is present. The brainstem and cerebellum are within normal limits. Midline structures are within normal limits. Vascular: No hyperdense vessel or unexpected calcification. Skull: Calvarium is intact. No focal lytic or blastic lesions are present. No significant extracranial soft tissue lesion is present. Sinuses/Orbits: Scree shins are present in the left sphenoid sinus. The paranasal sinuses and mastoid air cells are otherwise clear. The globes and orbits are within normal limits. CTA NECK FINDINGS Aortic arch: Common origin of the left common carotid artery and innominate artery is noted, a normal variant. No significant atherosclerotic disease, stenosis or aneurysm is present at the aortic arch. Right carotid system: The right common carotid artery is within normal limits. Bifurcation is unremarkable. The cervical right ICA is normal. Left carotid system: The left common carotid artery is within normal limits. The bifurcation is unremarkable. The cervical left ICA is normal. Vertebral arteries: The left vertebral artery is the dominant vessel. Both vertebral arteries originate from the subclavian arteries without significant stenosis. No significant stenosis is present in either vertebral artery in the neck. Skeleton: The vertebral body heights and alignment are normal. Straightening of the normal cervical lordosis is present. Uncovertebral  spurring is noted at C4-5, C5-6 and C6-7 with right greater than left. Other neck: Soft tissues the neck are otherwise unremarkable. Salivary glands are within normal limits. Thyroid is normal. No significant adenopathy is present. No focal mucosal or submucosal lesions are present. Upper chest: The lung apices are clear. The thoracic inlet is within normal limits. Review of the MIP images confirms the above findings CTA HEAD FINDINGS Anterior circulation: The internal carotid arteries are within normal limits from the high cervical segments through the ICA termini scratched at the internal carotid arteries are within normal limits from the skull base to the ICA termini. The A1 and M1 segments are normal. No definite anterior communicating artery is present. MCA bifurcations are within normal limits. The ACA and MCA branch vessels are normal. Posterior circulation: The left vertebral artery is the dominant vessel. A small distal V4 segment is present beyond the right PICA. The PICA origins are normal bilaterally. The vertebrobasilar junction and basilar artery are normal. The superior cerebellar arteries are patent bilaterally. Both posterior cerebral arteries originate from the basilar tip. The PCA branch vessels are normal bilaterally. Venous sinuses: The dural sinuses are patent. The straight sinus and deep cerebral veins are intact. Cortical veins are within normal limits. No significant vascular malformation is evident. Anatomic variants: None Review of the MIP images confirms the above findings IMPRESSION: 1. Normal noncontrast CT of the head. 2. Normal CTA of the neck. 3. Normal CTA circle-of-Willis without significant proximal stenosis, aneurysm, or branch vessel occlusion. 4. Multilevel spondylosis of the cervical spine. Electronically Signed   By: Marin Roberts M.D.   On: 12/31/2022 16:11    Assessment and Plan  Acute onset headache/ ?Meningitis Patient with history of migraine headaches  presenting with complaint of severe headache since this morning associated with neck stiffness, photophobia, and nausea.  CTA head and neck negative for acute abnormality.  She had LP done in the ED due to concern for possible meningitis, CSF analysis labs pending.  No fever or leukocytosis.  No confusion.  No nuchal rigidity appreciated on exam at this time.  Patient reports significant improvement after receiving headache cocktail  in the ED.  Will continue vancomycin and ceftriaxone for empiric meningitis coverage until CSF analysis lab results are back.  Will need ID consultation if labs confirm meningitis.  History of hyperthyroidism She is no longer taking methimazole.  Check TSH.  Anxiety Continue bupropion and sertraline.  CAD/history of MI in 2008 Stable, not endorsing any anginal symptoms.  DVT prophylaxis: SCDs Code Status: Full Code (discussed with patient) Family Communication: Husband at bedside. Level of care: Med-Surg Admission status: It is my clinical opinion that referral for OBSERVATION is reasonable and necessary in this patient based on the above information provided. The aforementioned taken together are felt to place the patient at high risk for further clinical deterioration. However, it is anticipated that the patient may be medically stable for discharge from the hospital within 24 to 48 hours.   John Giovanni MD [facility]  If 7PM-7AM, please contact night-coverage [email or web address]  12/31/2022, 9:48 PM

## 2022-12-31 NOTE — ED Triage Notes (Signed)
Headache that started this AM and has been intensifying throughout the day. Pt went to PCP and they sent her here for concern for meningeal irritation. Pt describes pain as a thorbbing, pulsating pain around entire top of head. C/o blurry vision that is worse in right eye and light/sound sensitivity.

## 2022-12-31 NOTE — ED Notes (Signed)
Patient transported to IR 

## 2022-12-31 NOTE — ED Provider Notes (Signed)
EMERGENCY DEPARTMENT AT [facility] Provider Note   CSN: [id number] Arrival date & time: 12/31/22  1235     History  Chief Complaint  Patient presents with   Headache    Peggy Mahoney is a 47 y.o. female.   Headache 47 year old female history of hypothyroidism, GERD, anxiety presenting for severe headache.  She states around 3 AM she woke up with severe sudden onset frontal headache.  It has gotten worse and now she has some neck stiffness.  She was seen at a walk-in clinic who sent her here for evaluation.  Has not had any fevers or chills.  No trauma.  Not on anticoagulation.  She has some bilateral blurry vision but no weakness or numbness.  She has history of migraines but has not had headaches like this before.     Home Medications Prior to Admission medications   Medication Sig Start Date End Date Taking? Authorizing Provider  buPROPion (WELLBUTRIN SR) 150 MG 12 hr tablet Take 150 mg by mouth in the morning.   Yes [provider]  sertraline (ZOLOFT) 50 MG tablet Take 75 mg by mouth in the morning.   Yes [provider]  ALPRAZolam Prudy Feeler) 0.5 MG tablet Take 1-2 pills as needed on call to MRI, may take a third pill if needed. Patient not taking: Reported on 10/14/2021 10/14/20   Huston Foley, MD  diclofenac Sodium (VOLTAREN) 1 % GEL Apply 2 g topically 4 (four) times daily as needed. Patient not taking: Reported on 10/14/2021 07/07/21   Long, Arlyss Repress, MD  meloxicam (MOBIC) 15 MG tablet Take 1 tablet daily as needed for chest wall pain. Patient not taking: Reported on 12/31/2022 08/09/19   Molpus, John, MD  methimazole (TAPAZOLE) 5 MG tablet Take 1 tablet (5 mg total) by mouth daily. Patient not taking: Reported on 12/31/2022 10/18/16   Romero Belling, MD  Rimegepant Sulfate (NURTEC) 75 MG TBDP TAKE 1 TABLET BY MOUTH ONCE AS NEEDED FOR UP TO 1 DOSE. MAY REPEAT ONCE AFTER 2 HOURS. Must be seen for further refills, call  [phone]. Patient not taking: Reported on 12/31/2022 05/02/22   Huston Foley, MD  senna-docusate (SENOKOT-S) 8.6-50 MG tablet Take 1 tablet by mouth at bedtime as needed for mild constipation or moderate constipation. Patient not taking: Reported on 12/31/2022 07/07/21   Long, Arlyss Repress, MD  promethazine (PHENERGAN) 25 MG tablet Take 25 mg by mouth every 8 (eight) hours as needed for nausea or vomiting.  08/09/19  [provider]      Allergies    Naproxen, Pineapple, Oxycodone-acetaminophen, Prednisone, and Diclofenac sodium    Review of Systems   Review of Systems  Neurological:  Positive for headaches.  Review of systems completed and notable as per HPI.  ROS otherwise negative.   Physical Exam Updated Vital Signs BP 105/62   Pulse 76   Temp 98.3 F (36.8 C) (Oral)   Resp 18   Ht 5\' 4"  (1.626 m)   Wt 98 kg   LMP 11/12/2017 (Approximate)   SpO2 98%   BMI 37.08 kg/m  Physical Exam Vitals and nursing note reviewed.  Constitutional:      Comments: Uncomfortable  HENT:     Head: Normocephalic and atraumatic.  Eyes:     General: No visual field deficit.    Extraocular Movements: Extraocular movements intact.     Right eye: No nystagmus.     Left eye: No nystagmus.     Conjunctiva/sclera: Conjunctivae  normal.     Pupils: Pupils are equal, round, and reactive to light.  Neck:     Comments: Pain at the base of the neck with flexion extension Cardiovascular:     Rate and Rhythm: Normal rate and regular rhythm.     Heart sounds: No murmur heard. Pulmonary:     Effort: Pulmonary effort is normal. No respiratory distress.     Breath sounds: Normal breath sounds.  Abdominal:     Palpations: Abdomen is soft.     Tenderness: There is no abdominal tenderness.  Musculoskeletal:        General: No swelling.     Cervical back: Neck supple.  Skin:    General: Skin is warm and dry.     Capillary Refill: Capillary refill takes less than 2 seconds.  Neurological:      Mental Status: She is alert and oriented to person, place, and time.     GCS: GCS eye subscore is 4. GCS verbal subscore is 5. GCS motor subscore is 6.     Cranial Nerves: No cranial nerve deficit, dysarthria or facial asymmetry.     Sensory: No sensory deficit.     Coordination: Coordination normal.     Comments: Gait deferred secondary to pain.  Psychiatric:        Mood and Affect: Mood normal.     ED Results / Procedures / Treatments   Labs (all labs ordered are listed, but only abnormal results are displayed) Labs Reviewed  BASIC METABOLIC PANEL - Abnormal; Notable for the following components:      Result Value   Calcium 8.7 (*)    All other components within normal limits  CBC WITH DIFFERENTIAL/PLATELET - Abnormal; Notable for the following components:   RBC 5.25 (*)    MCH 25.0 (*)    All other components within normal limits  CSF CULTURE W GRAM STAIN  CULTURE, BLOOD (ROUTINE X 2)  CULTURE, BLOOD (ROUTINE X 2)  PROTEIN AND GLUCOSE, CSF  CSF CELL COUNT WITH DIFFERENTIAL  HIV ANTIBODY (ROUTINE TESTING W REFLEX)  CBC  TSH    EKG None  Radiology DG Lumbar Puncture Fluoro Guide  Result Date: 12/31/2022 CLINICAL DATA:  Meningitis.  Severe headache.  Neck pain. EXAM: DIAGNOSTIC LUMBAR PUNCTURE UNDER FLUOROSCOPIC GUIDANCE COMPARISON:  None Available. FLUOROSCOPY: Radiation Exposure Index (as provided by the fluoroscopic device): Dose area product 91.06 uGy*m2 PROCEDURE: Informed consent was obtained from the patient prior to the procedure, including potential complications of headache, allergy, and pain. With the patient prone, the lower back was prepped with Betadine. 1% Lidocaine was used for local anesthesia. Lumbar puncture was performed at the right paramedian L2-3 level using a 20 gauge needle with return of clear CSF with an opening pressure of 17.5 cm water. 12.0 ml of CSF were obtained for laboratory studies. The patient tolerated the procedure well and there were no  apparent complications. IMPRESSION: Technically successful fluoroscopic guided lumbar puncture via a right paramedian approach at the L2-3 level. Normal opening pressure of 17.5 cm water. 12.0 mL of clear CSF were obtained for laboratory studies. Electronically Signed   By: Marin Roberts M.D.   On: 12/31/2022 20:02   CT ANGIO HEAD NECK W WO CM  Result Date: 12/31/2022 CLINICAL DATA:  Sudden onset of severe headache. The headache has been intensifying throughout the day. EXAM: CT ANGIOGRAPHY HEAD AND NECK WITH AND WITHOUT CONTRAST TECHNIQUE: Multidetector CT imaging of the head and neck was performed using the standard  protocol during bolus administration of intravenous contrast. Multiplanar CT image reconstructions and MIPs were obtained to evaluate the vascular anatomy. Carotid stenosis measurements (when applicable) are obtained utilizing NASCET criteria, using the distal internal carotid diameter as the denominator. RADIATION DOSE REDUCTION: This exam was performed according to the departmental dose-optimization program which includes automated exposure control, adjustment of the mA and/or kV according to patient size and/or use of iterative reconstruction technique. CONTRAST:  75mL OMNIPAQUE IOHEXOL 350 MG/ML SOLN COMPARISON:  MR head without and with contrast 11/04/2020. CT head without contrast 07/09/2020. FINDINGS: CT HEAD FINDINGS Brain: No acute infarct, hemorrhage, or mass lesion is present. No significant white matter lesions are present. Deep brain nuclei are within normal limits. The ventricles are of normal size. No significant extraaxial fluid collection is present. The brainstem and cerebellum are within normal limits. Midline structures are within normal limits. Vascular: No hyperdense vessel or unexpected calcification. Skull: Calvarium is intact. No focal lytic or blastic lesions are present. No significant extracranial soft tissue lesion is present. Sinuses/Orbits: Scree shins are  present in the left sphenoid sinus. The paranasal sinuses and mastoid air cells are otherwise clear. The globes and orbits are within normal limits. CTA NECK FINDINGS Aortic arch: Common origin of the left common carotid artery and innominate artery is noted, a normal variant. No significant atherosclerotic disease, stenosis or aneurysm is present at the aortic arch. Right carotid system: The right common carotid artery is within normal limits. Bifurcation is unremarkable. The cervical right ICA is normal. Left carotid system: The left common carotid artery is within normal limits. The bifurcation is unremarkable. The cervical left ICA is normal. Vertebral arteries: The left vertebral artery is the dominant vessel. Both vertebral arteries originate from the subclavian arteries without significant stenosis. No significant stenosis is present in either vertebral artery in the neck. Skeleton: The vertebral body heights and alignment are normal. Straightening of the normal cervical lordosis is present. Uncovertebral spurring is noted at C4-5, C5-6 and C6-7 with right greater than left. Other neck: Soft tissues the neck are otherwise unremarkable. Salivary glands are within normal limits. Thyroid is normal. No significant adenopathy is present. No focal mucosal or submucosal lesions are present. Upper chest: The lung apices are clear. The thoracic inlet is within normal limits. Review of the MIP images confirms the above findings CTA HEAD FINDINGS Anterior circulation: The internal carotid arteries are within normal limits from the high cervical segments through the ICA termini scratched at the internal carotid arteries are within normal limits from the skull base to the ICA termini. The A1 and M1 segments are normal. No definite anterior communicating artery is present. MCA bifurcations are within normal limits. The ACA and MCA branch vessels are normal. Posterior circulation: The left vertebral artery is the dominant  vessel. A small distal V4 segment is present beyond the right PICA. The PICA origins are normal bilaterally. The vertebrobasilar junction and basilar artery are normal. The superior cerebellar arteries are patent bilaterally. Both posterior cerebral arteries originate from the basilar tip. The PCA branch vessels are normal bilaterally. Venous sinuses: The dural sinuses are patent. The straight sinus and deep cerebral veins are intact. Cortical veins are within normal limits. No significant vascular malformation is evident. Anatomic variants: None Review of the MIP images confirms the above findings IMPRESSION: 1. Normal noncontrast CT of the head. 2. Normal CTA of the neck. 3. Normal CTA circle-of-Willis without significant proximal stenosis, aneurysm, or branch vessel occlusion. 4. Multilevel spondylosis of the  cervical spine. Electronically Signed   By: Marin Roberts M.D.   On: 12/31/2022 16:11    Procedures .Lumbar Puncture  Date/Time: 12/31/2022 6:54 PM  Performed by: Laurence Spates, MD Authorized by: Laurence Spates, MD   Consent:    Consent obtained:  Verbal and written   Consent given by:  Patient   Risks, benefits, and alternatives were discussed: yes     Risks discussed:  Bleeding, headache, infection, nerve damage, pain and repeat procedure   Alternatives discussed:  Alternative treatment and no treatment Universal protocol:    Procedure explained and questions answered to patient or proxy's satisfaction: yes     Imaging studies available: yes     Patient identity confirmed:  Verbally with patient Pre-procedure details:    Procedure purpose:  Diagnostic   Preparation: Patient was prepped and draped in usual sterile fashion   Anesthesia:    Anesthesia method:  Local infiltration   Local anesthetic:  Lidocaine 1% w/o epi Procedure details:    Lumbar space:  L3-L4 interspace   Patient position:  L lateral decubitus   Needle gauge:  20   Needle type:  Spinal needle -  Quincke tip   Needle length (in):  3.5   Ultrasound guidance: no     Number of attempts:  2 Post-procedure details:    Procedure completion:  Tolerated (No fluid obtained)     Medications Ordered in ED Medications  cefTRIAXone (ROCEPHIN) 2 g in sodium chloride 0.9 % 100 mL IVPB (has no administration in time range)  buPROPion (WELLBUTRIN SR) 12 hr tablet 150 mg (has no administration in time range)  sertraline (ZOLOFT) tablet 75 mg (has no administration in time range)  vancomycin (VANCOCIN) IVPB 1000 mg/200 mL premix (has no administration in time range)  metoCLOPramide (REGLAN) injection 10 mg (10 mg Intravenous Given 12/31/22 1418)  diphenhydrAMINE (BENADRYL) injection 12.5 mg (12.5 mg Intravenous Given 12/31/22 1418)  lactated ringers bolus 1,000 mL (0 mLs Intravenous Stopped 12/31/22 1539)  iohexol (OMNIPAQUE) 350 MG/ML injection 75 mL (75 mLs Intravenous Contrast Given 12/31/22 1431)  cefTRIAXone (ROCEPHIN) 2 g in sodium chloride 0.9 % 100 mL IVPB (0 g Intravenous Stopped 12/31/22 1820)  lidocaine (PF) (XYLOCAINE) 1 % injection 10 mL (10 mLs Other Given by Other 12/31/22 1719)  vancomycin (VANCOREADY) IVPB 2000 mg/400 mL (0 mg Intravenous Stopped 12/31/22 2119)  diphenhydrAMINE (BENADRYL) injection 25 mg (25 mg Intravenous Given 12/31/22 2121)    ED Course/ Medical Decision Making/ A&P Clinical Course as of 12/31/22 2242  Sat Dec 31, 2022  1550 Discussed with radiology to expedite read. [JD]    Clinical Course User Index [JD] Laurence Spates, MD                             Medical Decision Making Amount and/or Complexity of Data Reviewed Labs: ordered. Radiology: ordered.  Risk Prescription drug management. Decision regarding hospitalization.   Medical Decision Making:   Hayliegh Rudzinski is a 47 y.o. female who presented to the ED today with significant discomfort and some neck stiffness severe sudden onset headache.  Vital signs reviewed.  Exam she has with new  onset sudden headache.  I am concerned for possible aneurysm or subarachnoid hemorrhage.  Discussed with nursing to expedite IV access and CTA head and neck.  No fevers or chills, though consider possible CNS infection.  She is not anticoagulated.  No trauma.  She has no  deficits on exam, low concern for CVA.  Consider migraine as well given history however meningismus and neck stiffness seem less consistent with this.   Patient placed on continuous vitals and telemetry monitoring while in ED which was reviewed periodically.  Reviewed and confirmed nursing documentation for past medical history, family history, social history.  Initial Study Results:   Laboratory  All laboratory results reviewed.  Labs notable for CBC, BMP unremarkable.  Radiology:  All images reviewed independently.  CTA neck reviewed, no signs of subarachnoid hemorrhage, aneurysm.  Agree with radiology report at this time.    Reassessment and Plan:   On reassessment, patient's pain is slightly improved.  She still has nuchal rigidity.  Her CTA was reviewed, no signs of hemorrhage or aneurysm.  She is slightly diaphoretic on my reexamination, will recheck temperature.  Given no signs of subarachnoid hemorrhage, I am concerned for possible CNS infection specifically meningitis.  Antibiotics ordered, patient consented for LP.  LP attempted, but unfortunately unsuccessful.  I talked with Dr. Minette Brine with radiology who plans to come in this evening to perform lower guided lumbar puncture.  Patient was updated on plan.  Discussed with hospitalist and admitted for empiric antibiotics and lumbar puncture.   Patient's presentation is most consistent with acute presentation with potential threat to life or bodily function.           Final Clinical Impression(s) / ED Diagnoses Final diagnoses:  Acute nonintractable headache, unspecified headache type    Rx / DC Orders ED Discharge Orders     None         Laurence Spates, MD 12/31/22 2242

## 2022-12-31 NOTE — Progress Notes (Signed)
A consult was received from an ED physician for Vanco per pharmacy dosing.  The patient's profile has been reviewed for ht/wt/allergies/indication/available labs.   A one time order has been placed for Vanco 2g IV x 1.  Further antibiotics/pharmacy consults should be ordered by admitting physician if indicated.           Lorenza Winkleman S. Merilynn Finland, PharmD, BCPS Clinical Staff Pharmacist [email or web address]              Thank you, Pasty Spillers 12/31/2022  4:49 PM

## 2022-12-31 NOTE — ED Notes (Signed)
Pt returned to room  

## 2022-12-31 NOTE — ED Notes (Signed)
Pt c/o itching all over. Small amount of Vancomycin left in the bag was stopped, MD notified.

## 2022-12-31 NOTE — ED Notes (Signed)
2 unsuccessful IV attempts.

## 2022-12-31 NOTE — Progress Notes (Signed)
Pharmacy Antibiotic Note  Peggy Mahoney is a 47 y.o. female admitted on 12/31/2022 with  medical history significant of anemia, anxiety, GERD, MI/CVA in 2008, MVP, hyperthyroidism, obesity , migraine headaches presented to ED complaining of severe sudden onset headache since this morning. Marland Kitchen  Pharmacy has been consulted to dose vancomycin for empiric meningitis coverage.  1st dose given in the ED  Plan: Vancomycin 1gm IV q12h (AUC 618.1, Scr 0.8, Cmin 18) Follow renal function ,cultures and clinical course  Height: 5\' 4"  (162.6 cm) Weight: 98 kg (216 lb) IBW/kg (Calculated) : 54.7  Temp (24hrs), Avg:98.3 F (36.8 C), Min:98.2 F (36.8 C), Max:98.3 F (36.8 C)  Recent Labs  Lab 12/31/22 1410  WBC 7.3  CREATININE 0.57    Estimated Creatinine Clearance: 97.8 mL/min (by C-G formula based on SCr of 0.57 mg/dL).    Allergies  Allergen Reactions   Naproxen Nausea And Vomiting and Other (See Comments)    GI Intolerance   Pineapple Swelling and Other (See Comments)    Blisters on tongue and lips   Oxycodone-Acetaminophen Hives and Rash   Prednisone Other (See Comments)    Stomach upset     Diclofenac Sodium Rash    Antimicrobials this admission: 7/12 vanc >> 7/12 CTX >>  Dose adjustments this admission:   Microbiology results: 7/12 BCx:  7/12 CSF :  Thank you for allowing pharmacy to be a part of this patient's care. Arley Phenix RPh 12/31/2022, 10:22 PM

## 2023-01-01 DIAGNOSIS — R519 Headache, unspecified: Secondary | ICD-10-CM | POA: Diagnosis not present

## 2023-01-01 LAB — CBC
HCT: 38.9 % (ref 36.0–46.0)
Hemoglobin: 11.8 g/dL — ABNORMAL LOW (ref 12.0–15.0)
MCH: 25.3 pg — ABNORMAL LOW (ref 26.0–34.0)
MCHC: 30.3 g/dL (ref 30.0–36.0)
MCV: 83.3 fL (ref 80.0–100.0)
Platelets: 322 10*3/uL (ref 150–400)
RBC: 4.67 MIL/uL (ref 3.87–5.11)
RDW: 14.3 % (ref 11.5–15.5)
WBC: 6.6 10*3/uL (ref 4.0–10.5)
nRBC: 0 % (ref 0.0–0.2)

## 2023-01-01 LAB — TSH: TSH: 1.241 u[IU]/mL (ref 0.350–4.500)

## 2023-01-01 LAB — HIV ANTIBODY (ROUTINE TESTING W REFLEX): HIV Screen 4th Generation wRfx: NONREACTIVE

## 2023-01-01 NOTE — Discharge Summary (Signed)
Physician Discharge Summary  Peggy Mahoney ZOX:[id number] DOB: 1975/10/06 DOA: 12/31/2022  PCP: Jarrett Soho, PA-C  Admit date: 12/31/2022 Discharge date: 01/01/2023  Admitted From: Home Disposition: Home  Recommendations for Outpatient Follow-up:  Follow up with PCP in 1 week Outpatient follow-up with neurology Follow up in ED if symptoms worsen or new appear   Home Health: No Equipment/Devices: None  Discharge Condition: Stable CODE STATUS: Full Diet recommendation: Heart healthy  Brief/Interim Summary: 47 y.o. female with medical history significant of anemia, anxiety, GERD, MI/CVA in 2008, MVP, hyperthyroidism, obesity, migraine headaches presented with severe headache.  On presentation, exam was concerning for meningismus.  She underwent IR guided LP.  She was empirically started on IV antibiotics after she received Benadryl and Reglan.  During the hospitalization, her headache has improved.  LP is not suggestive of meningitis.  She feels much better and wants to go home today.  She will be discharged home today with close outpatient follow-up with neurology.  Discharge Diagnoses:   Severe headache, most likely secondary to recurrent migraine -On presentation, exam was concerning for meningismus.  She underwent IR guided LP.  She was empirically started on IV antibiotics after she received Benadryl and Reglan.  During the hospitalization, her headache has improved.  LP is not suggestive of meningitis.  CSF WBC of 1.  Discontinue antibiotics.  She feels much better and wants to go home today.  She will be discharged home today with close outpatient follow-up with neurology.  Obesity -Outpatient follow-up  History of hyperthyroidism -Apparently not taking methimazole anymore.  Outpatient follow-up with PCP/endocrinology  Anxiety -Continue bupropion and sertraline  CAD/history of MI in 2008 -Stable.  No anginal symptoms.  Outpatient follow-up with  PCP/cardiology.    Discharge Instructions  Discharge Instructions     Ambulatory referral to Neurology   Complete by: As directed    An appointment is requested in approximately: 2 weeks   Diet general   Complete by: As directed    Increase activity slowly   Complete by: As directed       Allergies as of 01/01/2023       Reactions   Naproxen Nausea And Vomiting, Other (See Comments)   GI Intolerance   Pineapple Swelling, Other (See Comments)   Blisters on tongue and lips   Oxycodone-acetaminophen Hives, Rash   Prednisone Other (See Comments)   Stomach upset   Diclofenac Sodium Rash        Medication List     STOP taking these medications    ALPRAZolam 0.5 MG tablet Commonly known as: XANAX   diclofenac Sodium 1 % Gel Commonly known as: Voltaren   meloxicam 15 MG tablet Commonly known as: Mobic   Nurtec 75 MG Tbdp Generic drug: Rimegepant Sulfate   senna-docusate 8.6-50 MG tablet Commonly known as: Senokot-S       TAKE these medications    buPROPion 150 MG 12 hr tablet Commonly known as: WELLBUTRIN SR Take 150 mg by mouth in the morning.   methimazole 5 MG tablet Commonly known as: TAPAZOLE Take 1 tablet (5 mg total) by mouth daily.   sertraline 50 MG tablet Commonly known as: ZOLOFT Take 75 mg by mouth in the morning.        Follow-up Information     Jarrett Soho, PA-C. Schedule an appointment as soon as possible for a visit in 1 week(s).   Specialty: Family Medicine Contact information: [address] [place] [address] [phone]  Allergies  Allergen Reactions   Naproxen Nausea And Vomiting and Other (See Comments)    GI Intolerance   Pineapple Swelling and Other (See Comments)    Blisters on tongue and lips   Oxycodone-Acetaminophen Hives and Rash   Prednisone Other (See Comments)    Stomach upset     Diclofenac Sodium Rash    Consultations: None   Procedures/Studies: DG Lumbar  Puncture Fluoro Guide  Result Date: 12/31/2022 CLINICAL DATA:  Meningitis.  Severe headache.  Neck pain. EXAM: DIAGNOSTIC LUMBAR PUNCTURE UNDER FLUOROSCOPIC GUIDANCE COMPARISON:  None Available. FLUOROSCOPY: Radiation Exposure Index (as provided by the fluoroscopic device): Dose area product 91.06 uGy*m2 PROCEDURE: Informed consent was obtained from the patient prior to the procedure, including potential complications of headache, allergy, and pain. With the patient prone, the lower back was prepped with Betadine. 1% Lidocaine was used for local anesthesia. Lumbar puncture was performed at the right paramedian L2-3 level using a 20 gauge needle with return of clear CSF with an opening pressure of 17.5 cm water. 12.0 ml of CSF were obtained for laboratory studies. The patient tolerated the procedure well and there were no apparent complications. IMPRESSION: Technically successful fluoroscopic guided lumbar puncture via a right paramedian approach at the L2-3 level. Normal opening pressure of 17.5 cm water. 12.0 mL of clear CSF were obtained for laboratory studies. Electronically Signed   By: Marin Roberts M.D.   On: 12/31/2022 20:02   CT ANGIO HEAD NECK W WO CM  Result Date: 12/31/2022 CLINICAL DATA:  Sudden onset of severe headache. The headache has been intensifying throughout the day. EXAM: CT ANGIOGRAPHY HEAD AND NECK WITH AND WITHOUT CONTRAST TECHNIQUE: Multidetector CT imaging of the head and neck was performed using the standard protocol during bolus administration of intravenous contrast. Multiplanar CT image reconstructions and MIPs were obtained to evaluate the vascular anatomy. Carotid stenosis measurements (when applicable) are obtained utilizing NASCET criteria, using the distal internal carotid diameter as the denominator. RADIATION DOSE REDUCTION: This exam was performed according to the departmental dose-optimization program which includes automated exposure control, adjustment of the mA  and/or kV according to patient size and/or use of iterative reconstruction technique. CONTRAST:  75mL OMNIPAQUE IOHEXOL 350 MG/ML SOLN COMPARISON:  MR head without and with contrast 11/04/2020. CT head without contrast 07/09/2020. FINDINGS: CT HEAD FINDINGS Brain: No acute infarct, hemorrhage, or mass lesion is present. No significant white matter lesions are present. Deep brain nuclei are within normal limits. The ventricles are of normal size. No significant extraaxial fluid collection is present. The brainstem and cerebellum are within normal limits. Midline structures are within normal limits. Vascular: No hyperdense vessel or unexpected calcification. Skull: Calvarium is intact. No focal lytic or blastic lesions are present. No significant extracranial soft tissue lesion is present. Sinuses/Orbits: Scree shins are present in the left sphenoid sinus. The paranasal sinuses and mastoid air cells are otherwise clear. The globes and orbits are within normal limits. CTA NECK FINDINGS Aortic arch: Common origin of the left common carotid artery and innominate artery is noted, a normal variant. No significant atherosclerotic disease, stenosis or aneurysm is present at the aortic arch. Right carotid system: The right common carotid artery is within normal limits. Bifurcation is unremarkable. The cervical right ICA is normal. Left carotid system: The left common carotid artery is within normal limits. The bifurcation is unremarkable. The cervical left ICA is normal. Vertebral arteries: The left vertebral artery is the dominant vessel. Both vertebral arteries originate from the subclavian  arteries without significant stenosis. No significant stenosis is present in either vertebral artery in the neck. Skeleton: The vertebral body heights and alignment are normal. Straightening of the normal cervical lordosis is present. Uncovertebral spurring is noted at C4-5, C5-6 and C6-7 with right greater than left. Other neck: Soft  tissues the neck are otherwise unremarkable. Salivary glands are within normal limits. Thyroid is normal. No significant adenopathy is present. No focal mucosal or submucosal lesions are present. Upper chest: The lung apices are clear. The thoracic inlet is within normal limits. Review of the MIP images confirms the above findings CTA HEAD FINDINGS Anterior circulation: The internal carotid arteries are within normal limits from the high cervical segments through the ICA termini scratched at the internal carotid arteries are within normal limits from the skull base to the ICA termini. The A1 and M1 segments are normal. No definite anterior communicating artery is present. MCA bifurcations are within normal limits. The ACA and MCA branch vessels are normal. Posterior circulation: The left vertebral artery is the dominant vessel. A small distal V4 segment is present beyond the right PICA. The PICA origins are normal bilaterally. The vertebrobasilar junction and basilar artery are normal. The superior cerebellar arteries are patent bilaterally. Both posterior cerebral arteries originate from the basilar tip. The PCA branch vessels are normal bilaterally. Venous sinuses: The dural sinuses are patent. The straight sinus and deep cerebral veins are intact. Cortical veins are within normal limits. No significant vascular malformation is evident. Anatomic variants: None Review of the MIP images confirms the above findings IMPRESSION: 1. Normal noncontrast CT of the head. 2. Normal CTA of the neck. 3. Normal CTA circle-of-Willis without significant proximal stenosis, aneurysm, or branch vessel occlusion. 4. Multilevel spondylosis of the cervical spine. Electronically Signed   By: Marin Roberts M.D.   On: 12/31/2022 16:11      Subjective: Patient seen and examined at bedside.  Feels much better.  Denies any current headache.  Wants to go home today.  No fever or vomiting reported.  Discharge Exam: Vitals:    01/01/23 0411 01/01/23 0742  BP: 113/64 (!) 104/56  Pulse: 74 67  Resp: 18 18  Temp: 98 F (36.7 C) 97.9 F (36.6 C)  SpO2: 99% 99%    General: Pt is alert, awake, not in acute distress Cardiovascular: rate controlled, S1/S2 + Respiratory: bilateral decreased breath sounds at bases Abdominal: Soft, obese, NT, ND, bowel sounds + Extremities: no edema, no cyanosis    The results of significant diagnostics from this hospitalization (including imaging, microbiology, ancillary and laboratory) are listed below for reference.     Microbiology: Recent Results (from the past 240 hour(s))  Blood culture (routine x 2)     Status: None (Preliminary result)   Collection Time: 12/31/22  5:25 PM   Specimen: BLOOD  Result Value Ref Range Status   Specimen Description   Final    BLOOD RIGHT ANTECUBITAL Performed at [facility], [address], [place], [place] [address]    Special Requests   Final    BOTTLES DRAWN AEROBIC AND ANAEROBIC Blood Culture adequate volume Performed at [facility], [address], [place], [place] [address]    Culture   Final    NO GROWTH < 12 HOURS Performed at [facility], [address], [place], [place] [address]    Report Status PENDING  Incomplete  CSF culture w Gram Stain     Status: None (Preliminary result)  Collection Time: 12/31/22  7:47 PM   Specimen: Lumbar Puncture; Cerebrospinal Fluid  Result Value Ref Range Status   Specimen Description LP  Final   Special Requests NONE  Final   Gram Stain   Final    NO ORGANISMS SEEN Gram Stain Report Called to,Read Back By and Verified With: Janace Hoard RN @ 2229 12/31/22. GILBERTL Performed at [facility], [address] [place], [place] [address]    Culture PENDING  Incomplete   Report Status PENDING  Incomplete  Blood culture (routine x 2)     Status: None (Preliminary result)   Collection Time: 12/31/22 10:56 PM   Specimen: BLOOD   Result Value Ref Range Status   Specimen Description   Final    BLOOD BLOOD RIGHT HAND Performed at [facility], [address], [place], [place] [address]    Special Requests   Final    Blood Culture adequate volume BOTTLES DRAWN AEROBIC AND ANAEROBIC Performed at [facility], [address], [place], [place] [address]    Culture   Final    NO GROWTH < 12 HOURS Performed at [facility], [address] [place], [place] [address]    Report Status PENDING  Incomplete     Labs: BNP (last 3 results) No results for input(s): "BNP" in the last 8760 hours. Basic Metabolic Panel: Recent Labs  Lab 12/31/22 1410  NA 136  K 4.0  CL 106  CO2 23  GLUCOSE 97  BUN 14  CREATININE 0.57  CALCIUM 8.7*   Liver Function Tests: No results for input(s): "AST", "ALT", "ALKPHOS", "BILITOT", "PROT", "ALBUMIN" in the last 168 hours. No results for input(s): "LIPASE", "AMYLASE" in the last 168 hours. No results for input(s): "AMMONIA" in the last 168 hours. CBC: Recent Labs  Lab 12/31/22 1410 01/01/23 0523  WBC 7.3 6.6  NEUTROABS 4.9  --   HGB 13.1 11.8*  HCT 42.4 38.9  MCV 80.8 83.3  PLT 313 322   Cardiac Enzymes: No results for input(s): "CKTOTAL", "CKMB", "CKMBINDEX", "TROPONINI" in the last 168 hours. BNP: Invalid input(s): "POCBNP" CBG: No results for input(s): "GLUCAP" in the last 168 hours. D-Dimer No results for input(s): "DDIMER" in the last 72 hours. Hgb A1c No results for input(s): "HGBA1C" in the last 72 hours. Lipid Profile No results for input(s): "CHOL", "HDL", "LDLCALC", "TRIG", "CHOLHDL", "LDLDIRECT" in the last 72 hours. Thyroid function studies Recent Labs    01/01/23 0523  TSH 1.241   Anemia work up No results for input(s): "VITAMINB12", "FOLATE", "FERRITIN", "TIBC", "IRON", "RETICCTPCT" in the last 72 hours. Urinalysis    Component Value Date/Time   COLORURINE YELLOW 02/27/2018 1525   APPEARANCEUR CLEAR  02/27/2018 1525   LABSPEC >1.030 (H) 02/27/2018 1525   PHURINE 5.0 02/27/2018 1525   GLUCOSEU NEGATIVE 02/27/2018 1525   HGBUR SMALL (A) 02/27/2018 1525   BILIRUBINUR NEGATIVE 02/27/2018 1525   KETONESUR 15 (A) 02/27/2018 1525   PROTEINUR NEGATIVE 02/27/2018 1525   NITRITE NEGATIVE 02/27/2018 1525   LEUKOCYTESUR NEGATIVE 02/27/2018 1525   Sepsis Labs Recent Labs  Lab 12/31/22 1410 01/01/23 0523  WBC 7.3 6.6   Microbiology Recent Results (from the past 240 hour(s))  Blood culture (routine x 2)     Status: None (Preliminary result)   Collection Time: 12/31/22  5:25 PM   Specimen: BLOOD  Result Value Ref Range Status   Specimen Description   Final    BLOOD RIGHT ANTECUBITAL Performed  at [facility], [address], [place], [place] [address]    Special Requests   Final    BOTTLES DRAWN AEROBIC AND ANAEROBIC Blood Culture adequate volume Performed at [facility], [address] [place], [place] [address]    Culture   Final    NO GROWTH < 12 HOURS Performed at [facility], [address], [place], [place] [address]    Report Status PENDING  Incomplete  CSF culture w Gram Stain     Status: None (Preliminary result)   Collection Time: 12/31/22  7:47 PM   Specimen: Lumbar Puncture; Cerebrospinal Fluid  Result Value Ref Range Status   Specimen Description LP  Final   Special Requests NONE  Final   Gram Stain   Final    NO ORGANISMS SEEN Gram Stain Report Called to,Read Back By and Verified With: Janace Hoard RN @ 2229 12/31/22. GILBERTL Performed at [facility], [address], [place], [place] [address]    Culture PENDING  Incomplete   Report Status PENDING  Incomplete  Blood culture (routine x 2)     Status: None (Preliminary result)   Collection Time: 12/31/22 10:56 PM   Specimen: BLOOD  Result Value Ref Range Status   Specimen Description   Final    BLOOD BLOOD RIGHT HAND Performed at [facility], [address], [place], [place] [address]    Special Requests   Final    Blood Culture adequate volume BOTTLES DRAWN AEROBIC AND ANAEROBIC Performed at [facility], [address] [place], [place] [address]    Culture   Final    NO GROWTH < 12 HOURS Performed at [facility], [address] [place], [place] [address]    Report Status PENDING  Incomplete     Time coordinating discharge: 35 minutes  SIGNED:   Glade Lloyd, MD  [facility] 01/01/2023, 10:55 AM

## 2023-01-01 NOTE — Plan of Care (Signed)
  Problem: Education: Goal: Knowledge of General Education information will improve Description Including pain rating scale, medication(s)/side effects and non-pharmacologic comfort measures Outcome: Progressing   Problem: Health Behavior/Discharge Planning: Goal: Ability to manage health-related needs will improve Outcome: Progressing   Problem: Clinical Measurements: Goal: Will remain free from infection Outcome: Progressing Goal: Diagnostic test results will improve Outcome: Progressing Goal: Cardiovascular complication will be avoided Outcome: Progressing   Problem: Activity: Goal: Risk for activity intolerance will decrease Outcome: Progressing   Problem: Coping: Goal: Level of anxiety will decrease Outcome: Progressing   Problem: Pain Managment: Goal: General experience of comfort will improve Outcome: Progressing   Problem: Skin Integrity: Goal: Risk for impaired skin integrity will decrease Outcome: Progressing   

## 2023-01-02 LAB — CSF CULTURE W GRAM STAIN

## 2023-01-03 LAB — CSF CULTURE W GRAM STAIN: Culture: NO GROWTH

## 2023-01-04 LAB — CSF CULTURE W GRAM STAIN: Gram Stain: NONE SEEN

## 2023-01-05 LAB — CULTURE, BLOOD (ROUTINE X 2)
Culture: NO GROWTH
Special Requests: ADEQUATE

## 2023-01-06 LAB — CULTURE, BLOOD (ROUTINE X 2)
Culture: NO GROWTH
Special Requests: ADEQUATE

## 2023-01-17 ENCOUNTER — Ambulatory Visit
Admission: RE | Admit: 2023-01-17 | Discharge: 2023-01-17 | Disposition: A | Discharge status: Home / Self Care | Payer: BC Managed Care – PPO | Source: Ambulatory Visit

## 2023-01-17 DIAGNOSIS — Z1231 Encounter for screening mammogram for malignant neoplasm of breast: Secondary | ICD-10-CM

## 2023-01-18 ENCOUNTER — Other Ambulatory Visit: Payer: Self-pay | Admitting: Family Medicine

## 2023-01-18 DIAGNOSIS — R928 Other abnormal and inconclusive findings on diagnostic imaging of breast: Secondary | ICD-10-CM

## 2023-01-24 ENCOUNTER — Ambulatory Visit
Admission: RE | Admit: 2023-01-24 | Discharge: 2023-01-24 | Disposition: A | Discharge status: Home / Self Care | Payer: BC Managed Care – PPO | Source: Ambulatory Visit | Attending: Family Medicine | Admitting: Family Medicine

## 2023-01-24 ENCOUNTER — Other Ambulatory Visit: Payer: Self-pay | Admitting: Family Medicine

## 2023-01-24 DIAGNOSIS — R928 Other abnormal and inconclusive findings on diagnostic imaging of breast: Secondary | ICD-10-CM

## 2023-01-24 DIAGNOSIS — N6320 Unspecified lump in the left breast, unspecified quadrant: Secondary | ICD-10-CM

## 2023-01-26 ENCOUNTER — Other Ambulatory Visit: Payer: Self-pay | Admitting: Family Medicine

## 2023-01-26 ENCOUNTER — Ambulatory Visit
Admission: RE | Admit: 2023-01-26 | Discharge: 2023-01-26 | Disposition: A | Discharge status: Home / Self Care | Payer: BC Managed Care – PPO | Source: Ambulatory Visit | Attending: Family Medicine | Admitting: Family Medicine

## 2023-01-26 DIAGNOSIS — N6320 Unspecified lump in the left breast, unspecified quadrant: Secondary | ICD-10-CM

## 2023-01-26 HISTORY — PX: BREAST BIOPSY: SHX20

## 2023-02-02 ENCOUNTER — Encounter: Payer: Self-pay | Admitting: Neurology

## 2023-02-02 ENCOUNTER — Institutional Professional Consult (permissible substitution): Payer: BC Managed Care – PPO | Admitting: Neurology

## 2023-02-02 VITALS — BP 117/71 | HR 70 | Ht 64.0 in | Wt 215.0 lb

## 2023-02-02 DIAGNOSIS — G43009 Migraine without aura, not intractable, without status migrainosus: Secondary | ICD-10-CM

## 2023-02-02 DIAGNOSIS — G43109 Migraine with aura, not intractable, without status migrainosus: Secondary | ICD-10-CM | POA: Diagnosis not present

## 2023-02-02 MED ORDER — NURTEC 75 MG PO TBDP: 75.0000 mg | ORAL_TABLET | Freq: Once | ORAL | 3 refills | Status: DC | PRN

## 2023-02-02 MED ORDER — PROMETHAZINE HCL 25 MG PO TABS: 25.0000 mg | ORAL_TABLET | Freq: Three times a day (TID) | ORAL | 0 refills | Status: AC | PRN

## 2023-02-02 NOTE — Progress Notes (Signed)
Subjective:    Patient ID: Peggy Mahoney is a 47 y.o. female.  HPI    Huston Foley, MD, PhD [facility] [address] [address] [place], [place] [address]  I saw patient, Peggy Mahoney, as a referral from the hospital for recurrent headaches.  The patient is unaccompanied today.  Peggy Mahoney is a 47 year old female with an underlying medical history of colitis, anemia, reflux disease, history of mitral valve prolapse, history of MI, history of stroke, hypothyroidism, anxiety and obesity, who reports an increase in her migraines in the past year.  She does endorse stressors, lost her dad last year and her mom died prior to that in 03/04/2021 with dementia.  She reports having some family stress, dealing with her dad's estate.  She is the youngest of a total of 5 siblings.  She tries to hydrate well and limits her caffeine to 1-1/2 cups of coffee per day.  She drinks alcohol rarely.  Migraines are associated with nausea and sometimes vomiting, she does not currently have a prescription for Phenergan but it has helped in the past, she does note that it is sedating for her.  She works full-time as a Runner, broadcasting/film/video, Engineer, drilling school hours, typically non-English-speaking, in Multimedia programmer school.  She also has 2 part-time jobs as a Engineer, technical sales.   With the last migraine she had visual disturbance, she woke up with a migraine headache in the middle of the night and had a visual scotoma, describing a halo in the center of her vision.  She has over-the-counter readers, she is supposed to have her routine eye exam later this year, has not scheduled it yet.  She denies any sudden onset of one-sided weakness or numbness or tingling or strokelike symptoms.   Of note, she presented to the emergency room at [facility] on 12/31/2022 with a sudden onset of frontal headache.  I reviewed the emergency room records.  A lumbar puncture was attempted but was unsuccessful in the ED.  She was  treated empirically for meningitis.  She ended up having a fluoroscopic guided lumbar puncture which showed a normal opening pressure of 17.5 cm.    CT angiogram of the head and neck with and without contrast on 12/31/2022 showed:  IMPRESSION: 1. Normal noncontrast CT of the head. 2. Normal CTA of the neck. 3. Normal CTA circle-of-Willis without significant proximal stenosis, aneurysm, or branch vessel occlusion. 4. Multilevel spondylosis of the cervical spine.   She was admitted overnight, she was treated symptomatically for her headaches with Benadryl and Reglan in the ER, as well as receiving vancomycin, Rocephin, and IV fluids. Laboratory testing, particularly CSF testing did not suggest any meningitis and I reviewed other lab test results.  HIV was nonreactive, TSH normal at 1.24, BMP showed benign findings, CBC with differential benign with WBC of 7.3, negative blood cultures.  She was felt to have a migraine headache. I also received a primary care visit note from 01/06/2023 she is currently on bupropion SR 150 mg once daily.  She had blood work through your office on 12/15/2022 and I was able to review the results.  BMP showed benign findings, BUN was 13, creatinine 0.81, TSH was in the normal range at 0.67, lipid panel showed total cholesterol of 198, triglycerides 92, LDL mildly elevated at 23.  Throughout total cholesterol 198. She is no longer taking Nurtec for her migraines.  Migraine frequency is a little more than it used to be, currently about twice  a month.  She reports that she no longer has a prescription for Nurtec but it worked well for her besides the aftertaste that she did not care for. I had previously evaluated her for migraine headaches.  She was on Nurtec at the time.  However, she has not been seen in this clinic in nearly 2 years.  Previously:   04/06/21: 47 year old right-handed woman with an underlying medical history of anemia, anxiety, reflux disease,  hypothyroidism, mitral valve prolapse, MI, stroke, and obesity, who presents for follow-up consultation of her recurrent headaches.  The patient is unaccompanied today.  I first met her at the request of her primary care PA on 10/12/2020, at which time she reported a several year history of recurrent migrainous headaches.  She felt that her headaches became worse after she had a fall in January.  Her history was in keeping with migraine headaches.  She was advised to start Nurtec as needed we talked about utilizing a preventative medication in the near future.  She was advised to proceed with a sleep study as she reported snoring and nocturia.  She was also advised to proceed with a brain MRI.  She had a brain MRI with and without contrast on 11/04/2020 and I reviewed the results: Impression: Unremarkable MRI brain with and without contrast.  No acute findings. She was notified of the test results.   She had a baseline sleep study on 11/18/2020.  Sleep efficiency was 93.8%, sleep latency 16 minutes, REM latency 164 minutes.  She had an increased percentage of stage II sleep.  She had a normal percentage of REM sleep.  Overall AHI was 2.5/h.  Average oxygen saturation 95%, nadir was 86% briefly.  She had no significant PLM's.   She reports doing well.  Her headaches are better.  So long as she takes the Nurtec soon enough after her headaches start, it works really well.  She feels like she is down to just a few headaches per month and they are tolerable, not currently in favor of adding a preventative.  About a month ago, she started having neck pain and saw her primary care.  She is now on Flexeril and meloxicam.  She also received an injection into the left shoulder.  She is trying to exercise, is trying to stay well-hydrated with water, drinks caffeine in the form of coffee, about 2 cups/day and is working on weight loss.  She does report stress as a trigger.  Nevertheless, overall, she is feeling better.  She  has no side effects from the Nurtec.  She is rather pleased with how it is working for her.    10/12/20: (She) reports recurrent headaches for the past several years.  Her headaches became worse after her fall in January.  Her headaches are typically behind both eyes and sometimes on top of her head.  She does not have a prior history of head injury or intracranial hemorrhage.  She does not report any developmental anomalies or delays.  She lives with her family which includes her husband, 2 daughters, and 40-month-old granddaughter.  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  She had a recent eye examination in January 2022 with [facility].  She has contact lenses.  With her headaches, she has associated light sensitivity and sometimes nausea, sometimes vomiting.  She has a family history of migraines affecting her mom.  Patient lost her mom at the age of 42 about 2 years ago, mom had dementia, migraine headaches,  diabetes, neuropathy and lung cancer.  Patient's father is 98 years old and has memory loss.  She reports a history of stroke in 2008.  She had work-up in [place], prior records or prior scans are not available.  She has a 7 to 8-year history of word finding difficulty.  She has a history of colitis and vertigo.  She has occasional visual blurriness with her headache but no classic visual aura such as scotoma.  She recuperated after her stroke.  She had right-sided weakness at the time of her stroke in 2008.  She does snore.  She has nocturia about once or twice per average night.  She has never had a sleep study.     I reviewed your office note from 07/23/2020.  She reported feeling better.  She had fallen in January, had hit her head against a door frame.  She did not lose consciousness.  She presented to the emergency room on 07/09/2020, I reviewed the ED records from [facility] emergency room from 07/09/2020.  She had some tingling in her right hand at the time of the ER visit.  She landed  on her right side and primarily on her right shoulder and upper arm and right rib cage.  She had a head CT and cervical spine CT without contrast on 07/09/2020 and I reviewed the results: IMPRESSION: 1. No acute intracranial abnormality. No scalp swelling or calvarial fracture. 2. Prominence of the extra-axial CSF filled space across the bilateral frontal convexities inter hemispheric fissure may reflect some frontal predominant, age advanced volume loss; hygroma or chronic subdural is less favored. 3. No acute cervical spine fracture or traumatic listhesis. 4. Multilevel degenerative changes of the cervical spine, as above.   She was treated symptomatically with Tylenol and fentanyl.  She was given a prescription for Robaxin. For her headaches she has tried over-the-counter Motrin or Tylenol.  Current headache frequency is once or twice per week. She does not smoke or drink alcohol.  She drinks caffeine in the form of coffee, 1 or 2 cups/day on average.     Her Past Medical History Is Significant For: Past Medical History:  Diagnosis Date   Anemia    Anxiety    Bronchitis    has inhaler prn   Colitis    GERD (gastroesophageal reflux disease)    Hypothyroidism    MI (myocardial infarction) (HCC) 11/08/2006   MVP (mitral valve prolapse)    Pinched nerve in neck    Rotator cuff disorder, left    Stroke (HCC) 01/04/2007   SVD (spontaneous vaginal delivery)    x 3    Her Past Surgical History Is Significant For: Past Surgical History:  Procedure Laterality Date   ABDOMINAL HYSTERECTOMY     BREAST BIOPSY Left 01/26/2023   [place] LT BREAST BX W LOC DEV 1ST LESION IMG BX SPEC US GUIDE 01/26/2023 [facility]   DILATION AND CURETTAGE OF UTERUS     x 3 terminations   VAGINAL HYSTERECTOMY Bilateral 12/13/2017   Procedure: HYSTERECTOMY VAGINAL;  Surgeon: Gerald Leitz, MD;  Location: [facility];  Service: Gynecology;  Laterality: Bilateral;   WISDOM TOOTH EXTRACTION      Her Family History  Is Significant For: Family History  Problem Relation Age of Onset   Cancer Mother    Diabetes Mother    Hypertension Mother    Hyperlipidemia Mother    Glaucoma Mother    Cancer Father    Diabetes Father  Hyperlipidemia Father    Hypertension Father    Heart attack Father    Thyroid disease Neg Hx    Migraines Neg Hx     Her Social History Is Significant For: Social History   Socioeconomic History   Marital status: Married    Spouse name: Not on file   Number of children: Not on file   Years of education: Not on file   Highest education level: Not on file  Occupational History   Not on file  Tobacco Use   Smoking status: Never   Smokeless tobacco: Never  Vaping Use   Vaping status: Never Used  Substance and Sexual Activity   Alcohol use: No   Drug use: No   Sexual activity: Not on file  Other Topics Concern   Not on file  Social History Narrative   Not on file   Social Determinants of Health   Financial Resource Strain: Not on file  Food Insecurity: No Food Insecurity (01/01/2023)   Hunger Vital Sign    Worried About Running Out of Food in the Last Year: Never true    Ran Out of Food in the Last Year: Never true  Transportation Needs: No Transportation Needs (01/01/2023)   PRAPARE - Administrator, Civil Service (Medical): No    Lack of Transportation (Non-Medical): No  Physical Activity: Not on file  Stress: Not on file  Social Connections: Not on file    Her Allergies Are:  Allergies  Allergen Reactions   Naproxen Nausea And Vomiting and Other (See Comments)    GI Intolerance   Pineapple Swelling and Other (See Comments)    Blisters on tongue and lips   Oxycodone-Acetaminophen Hives and Rash   Prednisone Other (See Comments)    Stomach upset     Diclofenac Sodium Rash  :   Her Current Medications Are:  Outpatient Encounter Medications as of 02/02/2023  Medication Sig   buPROPion (WELLBUTRIN SR) 150 MG 12 hr tablet Take 150 mg by  mouth in the morning.   sertraline (ZOLOFT) 50 MG tablet Take 75 mg by mouth in the morning.   [DISCONTINUED] methimazole (TAPAZOLE) 5 MG tablet Take 1 tablet (5 mg total) by mouth daily. (Patient not taking: Reported on 12/31/2022)   [DISCONTINUED] promethazine (PHENERGAN) 25 MG tablet Take 25 mg by mouth every 8 (eight) hours as needed for nausea or vomiting.   No facility-administered encounter medications on file as of 02/02/2023.  :   Review of Systems:  Out of a complete 14 point review of systems, all are reviewed and negative with the exception of these symptoms as listed below:  Review of Systems  Neurological:        Rm 9 alone Pt is well, report she has about 1-2 sever headaches a month. The headaches will last all day and OTC meds no not help.    Objective:  Neurological Exam  Physical Exam Physical Examination:   Vitals:   02/02/23 0815  BP: 117/71  Pulse: 70    General Examination: The patient is a very pleasant 47 y.o. female in no acute distress. She appears well-developed and well-nourished and well groomed.   HEENT: Normocephalic, atraumatic, pupils are equal, round and reactive to light, extraocular tracking is well-preserved, no photophobia.  Hearing grossly intact.  Face is symmetric with normal facial animation. Speech is clear with no dysarthria noted. There is no hypophonia. There is no lip, neck/head, jaw or voice tremor. Neck is supple with  full range of passive and active motion. There are no carotid bruits on auscultation. Oropharynx exam reveals: mild mouth dryness, adequate dental hygiene and mild airway crowding.  Tongue protrudes centrally and palate elevates symmetrically.   Chest: Clear to auscultation without wheezing, rhonchi or crackles noted.   Heart: S1+S2+0, regular and normal without murmurs, rubs or gallops noted.    Abdomen: Soft, non-tender and non-distended.   Extremities: There is no pitting edema.   Skin: Warm and dry without  trophic changes noted.   Musculoskeletal: exam reveals no obvious joint deformities.     Neurologically:  Mental status: The patient is awake, alert and oriented in all 4 spheres. Her immediate and remote memory, attention, language skills and fund of knowledge are appropriate. There is no evidence of aphasia, agnosia, apraxia or anomia. Speech is clear with normal prosody and enunciation. Thought process is linear. Mood is normal and affect is normal.  Cranial nerves II - XII are as described above under HEENT exam.  Motor exam: Normal bulk, strength and tone is noted. There is no drift, tremor or rebound. Romberg is negative. Reflexes are 1-2+ throughout. Babinski: Toes are flexor bilaterally. Fine motor skills and coordination: intact grossly.   Cerebellar testing: No dysmetria or intention tremor. There is no truncal or gait ataxia.  Sensory exam: intact to light touch in the upper and lower extremities.  Gait, station and balance: She stands easily. No veering to one side is noted. No leaning to one side is noted. Posture is age-appropriate and stance is narrow based. Gait shows normal stride length and normal pace. No problems turning are noted. Tandem walk is unremarkable.    Assessment and Plan:    In summary, Peggy Mahoney is a very pleasant 47 year old female with an underlying medical history of anemia, anxiety, reflux disease, hypothyroidism, mitral valve prolapse, MI, stroke, and obesity, who presents for follow-up consultation of her migraine headaches. She had a brain MRI in May 2022 which was benign.  She had a ER visit last month for a more complex migraine, she had a visual aura as well at the time.  Current exam is nonfocal, migraine frequency about twice a month, she had good results with Nurtec but no longer has a prescription for it.  She is advised to schedule her routine eye exam for this year, she is advised to be mindful about her migraine triggers, she is trying to  hydrate well with water and has changed her diet to mostly vegetarian and pescatarian.  She is working on weight loss and reducing her cholesterol.  Stress can be a big trigger for her.  We mutually agreed to restart her on Nurtec 75 mg strength as needed.  I will also give her prescription for Phenergan as needed for nausea and vomiting but reminded her that it can be quite sedating.  She is advised to follow-up routinely in this clinic to see one of our nurse practitioners in 6 months, sooner if needed, we can offer her a video visit if she prefers.  If she is stable at the time, we can hopefully see her once a year routinely.  I answered all her questions today and she was in agreement with our plan.   I spent 45 minutes in total face-to-face time and in reviewing records during pre-charting, more than 50% of which was spent in counseling and coordination of care, reviewing test results, reviewing medications and treatment regimen and/or in discussing or reviewing the diagnosis of  migraine with aura and migraine without aura, the prognosis and treatment options. Pertinent laboratory and imaging test results that were available during this visit with the patient were reviewed by me and considered in my medical decision making (see chart for details).

## 2023-02-02 NOTE — Patient Instructions (Signed)
It was nice to see you again today.  As discussed, we will restart you on Nurtec for as needed use for acute migraine:  Nurtec 75 mg strength: Take 1 pill at onset of migraine headache, may repeat in 2 hours, no more than 2 pills in 24 hours. May cause sedation and nausea.   I will also renew your prescription for Phenergan 25 mg strength as needed for nausea and/or vomiting.  Please be mindful that it can be quite sedating, side effects in rare instances can include involuntary movements.  Please follow-up in 6 months to see one of our nurse practitioners.

## 2023-02-10 ENCOUNTER — Other Ambulatory Visit (HOSPITAL_COMMUNITY): Payer: Self-pay

## 2023-02-10 ENCOUNTER — Telehealth: Payer: Self-pay

## 2023-02-10 NOTE — Telephone Encounter (Signed)
Pharmacy Patient Advocate Encounter   Received notification from CoverMyMeds that prior authorization for Nurtec 75MG  ODT is required/requested.   Insurance verification completed.   The patient is insured through [facility] .   Per test claim:  [id number]

## 2023-02-11 ENCOUNTER — Other Ambulatory Visit (HOSPITAL_COMMUNITY): Payer: Self-pay

## 2023-02-13 ENCOUNTER — Other Ambulatory Visit (HOSPITAL_COMMUNITY): Payer: Self-pay

## 2023-02-13 NOTE — Telephone Encounter (Signed)
Pharmacy Patient Advocate Encounter  Received notification from [facility] that Prior Authorization for Nurtec 75MG  dispersible tablets has been APPROVED from 02/10/2023 to 02/09/2024. Ran test claim, Copay is $UNABLE to obtain due to REFILL TOO SOON rejection. Last filled on 02/10/2023, next fill due 9/15/20250.Marland Kitchen This test claim was processed through [facility]- copay amounts may vary at other pharmacies due to pharmacy/plan contracts, or as the patient moves through the different stages of their insurance plan.   PA #/Case ID/Reference #: PA Case ID #: [id number]

## 2023-03-22 ENCOUNTER — Telehealth: Payer: Self-pay | Admitting: Neurology

## 2023-03-22 ENCOUNTER — Institutional Professional Consult (permissible substitution): Payer: BC Managed Care – PPO | Admitting: Neurology

## 2023-03-22 NOTE — Telephone Encounter (Signed)
error 

## 2023-03-22 NOTE — Telephone Encounter (Signed)
Spoke to Pt and informed her spoke to [facility] and Nurtec will be able to be picked up  today Pt thanked me for calling

## 2023-03-22 NOTE — Telephone Encounter (Signed)
Pt came by check in to discuss nurtec prescription. It looks like it was approved through insurance, however she is having an issue with [facility] saying it has not been approved. Pt would like to see if there is anything she can take in the meantime to help with her migraines. Pt would like a call to discuss

## 2023-10-17 ENCOUNTER — Encounter: Payer: Self-pay | Admitting: Family Medicine

## 2023-10-17 DIAGNOSIS — Z0000 Encounter for general adult medical examination without abnormal findings: Secondary | ICD-10-CM

## 2023-11-07 ENCOUNTER — Other Ambulatory Visit: Payer: Self-pay | Admitting: Family Medicine

## 2023-11-07 DIAGNOSIS — N6452 Nipple discharge: Secondary | ICD-10-CM

## 2023-11-29 ENCOUNTER — Other Ambulatory Visit: Payer: Self-pay

## 2023-11-30 ENCOUNTER — Other Ambulatory Visit: Payer: Self-pay | Admitting: Medical Genetics

## 2023-12-01 ENCOUNTER — Ambulatory Visit
Admission: RE | Admit: 2023-12-01 | Discharge: 2023-12-01 | Disposition: A | Discharge status: Home / Self Care | Payer: Self-pay | Source: Ambulatory Visit | Attending: Family Medicine | Admitting: Family Medicine

## 2023-12-01 DIAGNOSIS — N6452 Nipple discharge: Secondary | ICD-10-CM

## 2023-12-05 ENCOUNTER — Other Ambulatory Visit (HOSPITAL_COMMUNITY): Payer: Self-pay

## 2023-12-19 ENCOUNTER — Other Ambulatory Visit: Payer: Self-pay | Admitting: Family Medicine

## 2023-12-19 DIAGNOSIS — Z1231 Encounter for screening mammogram for malignant neoplasm of breast: Secondary | ICD-10-CM

## 2024-01-05 ENCOUNTER — Other Ambulatory Visit (HOSPITAL_COMMUNITY)

## 2024-01-17 ENCOUNTER — Other Ambulatory Visit: Payer: Self-pay | Admitting: Family Medicine

## 2024-01-17 DIAGNOSIS — Z1231 Encounter for screening mammogram for malignant neoplasm of breast: Secondary | ICD-10-CM

## 2024-01-23 ENCOUNTER — Encounter

## 2024-01-23 DIAGNOSIS — Z1231 Encounter for screening mammogram for malignant neoplasm of breast: Secondary | ICD-10-CM

## 2024-01-24 ENCOUNTER — Ambulatory Visit
Admission: RE | Admit: 2024-01-24 | Discharge: 2024-01-24 | Disposition: A | Discharge status: Home / Self Care | Source: Ambulatory Visit

## 2024-01-24 DIAGNOSIS — Z1231 Encounter for screening mammogram for malignant neoplasm of breast: Secondary | ICD-10-CM

## 2024-02-12 ENCOUNTER — Telehealth: Payer: Self-pay | Admitting: Pharmacist

## 2024-02-12 NOTE — Telephone Encounter (Signed)
 Pharmacy Patient Advocate Encounter  Received notification from [facility] that Prior Authorization for Nurtec 75MG  dispersible tablets has been APPROVED from 02/12/2024 to 02/11/2025   PA #/Case ID/Reference #: [id number]

## 2024-02-12 NOTE — Telephone Encounter (Signed)
 Pharmacy Patient Advocate Encounter   Received notification from CoverMyMeds that prior authorization for Nurtec 75MG  dispersible tablets is required/requested.   Insurance verification completed.   The patient is insured through [facility] .   Per test claim: PA required; PA submitted to above mentioned insurance via Latent Key/confirmation #/EOC [id number] Status is pending

## 2024-02-26 ENCOUNTER — Emergency Department (HOSPITAL_BASED_OUTPATIENT_CLINIC_OR_DEPARTMENT_OTHER)
Admission: EM | Admit: 2024-02-26 | Discharge: 2024-02-26 | Disposition: A | Discharge status: Home / Self Care | Attending: Emergency Medicine | Admitting: Emergency Medicine

## 2024-02-26 ENCOUNTER — Other Ambulatory Visit: Payer: Self-pay

## 2024-02-26 DIAGNOSIS — E039 Hypothyroidism, unspecified: Secondary | ICD-10-CM | POA: Diagnosis not present

## 2024-02-26 DIAGNOSIS — R202 Paresthesia of skin: Secondary | ICD-10-CM | POA: Diagnosis present

## 2024-02-26 DIAGNOSIS — M5412 Radiculopathy, cervical region: Secondary | ICD-10-CM | POA: Diagnosis not present

## 2024-02-26 DIAGNOSIS — Z8673 Personal history of transient ischemic attack (TIA), and cerebral infarction without residual deficits: Secondary | ICD-10-CM | POA: Diagnosis not present

## 2024-02-26 MED ORDER — DEXAMETHASONE SODIUM PHOSPHATE 10 MG/ML IJ SOLN
10.0000 mg | Freq: Once | INTRAMUSCULAR | Status: AC
  Administered 2024-02-26: 10 mg via INTRAMUSCULAR
  Filled 2024-02-26: qty 1

## 2024-02-26 MED ORDER — MELOXICAM 7.5 MG PO TABS: 7.5000 mg | ORAL_TABLET | Freq: Every day | ORAL | 0 refills | Status: AC

## 2024-02-26 NOTE — ED Provider Notes (Signed)
 [place] [facility] Provider Note   CSN: [id number] Arrival date & time: 02/26/24  1507     Patient presents with: Numbness   Peggy Mahoney is a 48 y.o. female.   Patient is a 48 year old female who presents with some pain and tingling in her right arm.  She said it started about 6 weeks ago.  It has been worse over the last 5 to 6 days.  She said it seems to start in her neck and radiates down her arm.  It is also worse in her elbow radiating down her forearm.  She describes tingling in her fingers, mostly in her thumb and first 2 fingers but sometimes she will get them in her ulnar fingers.  She notes some intermittent weakness in the hand, this seems to be worse when the tingling gets worse.  It is worse with certain positions of her arm and if she lays her arm down on the table or computer desk.  Is not worse with movement of her neck.  No associated chest pain.  No involvement of the leg.  No speech deficits or vision changes.  She went to [facility] walk-in clinic August 18 and was advised to make an appointment with [facility] but she said she was not able to get in yet.  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  She says that this happened last week as well.  It seems to happen when the tingling is worse.  When the tingling is not so bad, she does not notice any weakness in her arm.  No recent injuries.  No fevers.       Prior to Admission medications   Medication Sig Start Date End Date Taking? Authorizing Provider  meloxicam  (MOBIC ) 7.5 MG tablet Take 1 tablet (7.5 mg total) by mouth daily. 02/26/24  Yes Lenor Hollering, MD  buPROPion  (WELLBUTRIN  SR) 150 MG 12 hr tablet Take 150 mg by mouth in the morning.    [provider]  promethazine  (PHENERGAN ) 25 MG tablet Take 1 tablet (25 mg total) by mouth every 8 (eight) hours as needed for nausea or vomiting. 02/02/23   Buck Saucer, MD  Rimegepant Sulfate  (NURTEC) 75 MG TBDP Take 1 tablet (75 mg total) by mouth once as needed for up to 1 dose. May repeat once after 2 hours, no more than 2 pills in 24 h. 02/02/23   Buck Saucer, MD  sertraline  (ZOLOFT ) 50 MG tablet Take 75 mg by mouth in the morning.    [provider]    Allergies: Naproxen, Pineapple, Oxycodone -acetaminophen , Prednisone , and Diclofenac  sodium    Review of Systems  Constitutional:  Negative for fever.  Gastrointestinal:  Negative for nausea and vomiting.  Musculoskeletal:  Positive for arthralgias and neck pain. Negative for back pain and joint swelling.  Skin:  Negative for wound.  Neurological:  Positive for weakness and numbness. Negative for headaches.    Updated Vital Signs BP 126/68 (BP Location: Right Arm)   Pulse 83   Temp 98.3 F (36.8 C)   Resp 18   Ht 5' 4 (1.626 m)   LMP 11/12/2017 (Approximate)   SpO2 98%   BMI 36.90 kg/m   Physical Exam Constitutional:      Appearance: She is well-developed.  HENT:     Head: Normocephalic and atraumatic.  Eyes:     Pupils: Pupils are equal, round, and reactive to light.  Cardiovascular:  Rate and Rhythm: Normal rate and regular rhythm.     Heart sounds: Normal heart sounds.  Pulmonary:     Effort: Pulmonary effort is normal. No respiratory distress.     Breath sounds: Normal breath sounds. No wheezing or rales.  Chest:     Chest wall: No tenderness.  Abdominal:     General: Bowel sounds are normal.     Palpations: Abdomen is soft.     Tenderness: There is no abdominal tenderness. There is no guarding or rebound.  Musculoskeletal:        General: Normal range of motion.     Cervical back: Normal range of motion and neck supple.     Comments: Positive tenderness in the right paracervical area at the base of the neck.  No midline tenderness.  There is also some pain on range of motion of the shoulder right shoulder and range of motion of the right elbow.  No swelling or deformity is noted.  She  has some decrease sensation to the right hand as compared to the left.  She seems to have some weakness in the right arm on gripping as well as flexing and extending the arm although is somewhat distractible.  She has normal sensation and motor function to the remainder of the extremities, cranial nerves II through XII grossly intact  Lymphadenopathy:     Cervical: No cervical adenopathy.  Skin:    General: Skin is warm and dry.     Findings: No rash.  Neurological:     Mental Status: She is alert and oriented to person, place, and time.     (all labs ordered are listed, but only abnormal results are displayed) Labs Reviewed - No data to display  EKG: EKG Interpretation Date/Time:  Monday February 26 2024 15:21:13 EDT Ventricular Rate:  81 PR Interval:  153 QRS Duration:  75 QT Interval:  375 QTC Calculation: 436 R Axis:   21  Text Interpretation: Sinus rhythm Borderline T abnormalities, anterior leads since last tracing no significant change Confirmed by Lenor Hollering [phone]) on 02/26/2024 3:41:46 PM  Radiology: No results found.   Procedures   Medications Ordered in the ED  dexamethasone  (DECADRON ) injection 10 mg (has no administration in time range)                                    Medical Decision Making Risk Prescription drug management.   This patient presents to the ED for concern of neck pain, arm weakness, this involves an extensive number of treatment options, and is a complaint that carries with it a high risk of complications and morbidity.  I considered the following differential and admission for this acute, potentially life threatening condition.  The differential diagnosis includes cervical radiculopathy, nerve impingement, spinal epidural abscess/hematoma, stroke, ACS  MDM:    Patient is a 48 year old who presents with radicular pain to her right arm associated with some numbness and weakness.  This has been going on for about 6 weeks.  No recent  injury.  She does not have a fever or midline tenderness that would be more concerning for epidural abscess.  She is not anticoagulants.  No other red flag symptoms.  She does not have symptoms that be more concerning for stroke or ACS.  She was given a shot of Decadron  in the ED.  She reports an allergy to prednisone  but this is reported as  nausea or vomiting.  She reports that she can take an injection.  She reports allergies to Aleve and diclofenac .  However she reports that these sometimes upset her stomach but she does take ibuprofen .  Will prescribe Mobic .  She we will call her doctor about following up this week.  Advised that she will possibly need an MRI if her symptoms are improving.  She was discharged home in good condition.  Return precautions were given.  (Labs, imaging, consults)  Labs: I Ordered, and personally interpreted labs.  The pertinent results include:     Imaging Studies ordered: I ordered imaging studies including   I independently visualized and interpreted imaging. I agree with the radiologist interpretation  Additional history obtained from chart.  External records from outside source obtained and reviewed including PCP notes  Cardiac Monitoring: The patient was not maintained on a cardiac monitor.  If on the cardiac monitor, I personally viewed and interpreted the cardiac monitored which showed an underlying rhythm of:    Reevaluation: After the interventions noted above, I reevaluated the patient and found that they have :stayed the same  Social Determinants of Health:    Disposition: Discharged to home  Co morbidities that complicate the patient evaluation  Past Medical History:  Diagnosis Date   Anemia    Anxiety    Bronchitis    has inhaler prn   Colitis    GERD (gastroesophageal reflux disease)    Hypothyroidism    MI (myocardial infarction) (HCC) 11/08/2006   MVP (mitral valve prolapse)    Pinched nerve in neck    Rotator cuff disorder, left     Stroke (HCC) 01/04/2007   SVD (spontaneous vaginal delivery)    x 3     Medicines Meds ordered this encounter  Medications   dexamethasone  (DECADRON ) injection 10 mg   meloxicam  (MOBIC ) 7.5 MG tablet    Sig: Take 1 tablet (7.5 mg total) by mouth daily.    Dispense:  20 tablet    Refill:  0    I have reviewed the patients home medicines and have made adjustments as needed  Problem List / ED Course: Problem List Items Addressed This Visit   None Visit Diagnoses       Cervical radiculopathy    -  Primary                Final diagnoses:  Cervical radiculopathy    ED Discharge Orders          Ordered    meloxicam  (MOBIC ) 7.5 MG tablet  Daily        02/26/24 1545               Lenor Hollering, MD 02/26/24 1553

## 2024-02-26 NOTE — Discharge Instructions (Addendum)
 Follow-up with your primary care doctor/orthopedist as soon as possible.  Return to emergency room if you have any worsening symptoms.

## 2024-02-26 NOTE — ED Triage Notes (Signed)
 Pt c/o paresthesia in R arm from shoulder to hand x 6 weeks, pain worsening in last 4-5 days. Today had difficulty with grip strength in R hand.   Pt reports L side weakness, short term memory impairment, L side facial droop residual from previous stroke.

## 2024-04-11 ENCOUNTER — Other Ambulatory Visit: Payer: Self-pay | Admitting: Medical Genetics

## 2024-04-11 DIAGNOSIS — Z006 Encounter for examination for normal comparison and control in clinical research program: Secondary | ICD-10-CM

## 2024-04-27 ENCOUNTER — Other Ambulatory Visit: Payer: Self-pay | Admitting: Neurology

## 2024-05-13 LAB — GENECONNECT MOLECULAR SCREEN: Genetic Analysis Overall Interpretation: NEGATIVE

## 2024-05-14 ENCOUNTER — Other Ambulatory Visit: Payer: Self-pay | Admitting: Neurology

## 2024-05-14 NOTE — Telephone Encounter (Signed)
Pt called wanting to know when this will be filled. Please advise.

## 2024-07-10 ENCOUNTER — Other Ambulatory Visit: Payer: Self-pay | Admitting: Physician Assistant

## 2024-07-10 DIAGNOSIS — N6452 Nipple discharge: Secondary | ICD-10-CM

## 2024-07-11 ENCOUNTER — Ambulatory Visit
Admission: RE | Admit: 2024-07-11 | Discharge: 2024-07-11 | Disposition: A | Discharge status: Home / Self Care | Source: Ambulatory Visit | Attending: Physician Assistant | Admitting: Physician Assistant

## 2024-07-11 DIAGNOSIS — N6452 Nipple discharge: Secondary | ICD-10-CM
